# Patient Record
Sex: Female | Born: 1985 | Race: Black or African American | Hispanic: No | Marital: Married | Smoking: Never smoker
Health system: Southern US, Community
[De-identification: ages and names within clinical notes are randomized; demographics above are authoritative.]

## PROBLEM LIST (undated history)

## (undated) ENCOUNTER — Inpatient Hospital Stay (HOSPITAL_COMMUNITY): Payer: Self-pay

## (undated) DIAGNOSIS — R51 Headache: Secondary | ICD-10-CM

## (undated) DIAGNOSIS — Z789 Other specified health status: Secondary | ICD-10-CM

## (undated) DIAGNOSIS — O139 Gestational [pregnancy-induced] hypertension without significant proteinuria, unspecified trimester: Secondary | ICD-10-CM

## (undated) DIAGNOSIS — K8020 Calculus of gallbladder without cholecystitis without obstruction: Secondary | ICD-10-CM

## (undated) HISTORY — PX: EYE SURGERY: SHX253

## (undated) HISTORY — PX: DILATION AND CURETTAGE OF UTERUS: SHX78

## (undated) HISTORY — PX: BREAST REDUCTION SURGERY: SHX8

---

## 2011-11-22 ENCOUNTER — Emergency Department (HOSPITAL_COMMUNITY)
Admission: EM | Admit: 2011-11-22 | Discharge: 2011-11-23 | Disposition: A | Discharge status: Home / Self Care | Payer: No Typology Code available for payment source | Attending: Emergency Medicine | Admitting: Emergency Medicine

## 2011-11-22 ENCOUNTER — Encounter (HOSPITAL_COMMUNITY): Payer: Self-pay | Admitting: Emergency Medicine

## 2011-11-22 DIAGNOSIS — Z3490 Encounter for supervision of normal pregnancy, unspecified, unspecified trimester: Secondary | ICD-10-CM

## 2011-11-22 DIAGNOSIS — O219 Vomiting of pregnancy, unspecified: Secondary | ICD-10-CM | POA: Insufficient documentation

## 2011-11-22 DIAGNOSIS — R6883 Chills (without fever): Secondary | ICD-10-CM | POA: Insufficient documentation

## 2011-11-22 NOTE — ED Notes (Signed)
Pt states since early November she has been sick  Pt states she has had nausea  Pt states she has periods of vomiting  Pt states now she is having chills  Last time had vomiting was this morning  Pt states has had abd cramping intermittently

## 2011-11-23 LAB — URINALYSIS, ROUTINE W REFLEX MICROSCOPIC
Glucose, UA: NEGATIVE mg/dL
Specific Gravity, Urine: 1.036 — ABNORMAL HIGH (ref 1.005–1.030)

## 2011-11-23 LAB — URINE MICROSCOPIC-ADD ON

## 2011-11-23 LAB — POCT PREGNANCY, URINE: Preg Test, Ur: POSITIVE — AB

## 2011-11-23 MED ORDER — ONDANSETRON 8 MG PO TBDP
8.0000 mg | ORAL_TABLET | Freq: Once | ORAL | Status: AC
  Administered 2011-11-23: 8 mg via ORAL
  Filled 2011-11-23: qty 1

## 2011-11-23 MED ORDER — PROMETHAZINE HCL 25 MG PO TABS: 25.0000 mg | ORAL_TABLET | Freq: Four times a day (QID) | ORAL | Status: DC | PRN

## 2011-11-23 NOTE — ED Provider Notes (Signed)
Medical screening examination/treatment/procedure(s) were performed by non-physician practitioner and as supervising physician I was immediately available for consultation/collaboration.  Sunnie Nielsen, MD 11/23/11 [phone]

## 2011-11-23 NOTE — ED Provider Notes (Signed)
History     CSN: [id number]  Arrival date & time 11/22/11  2340   First MD Initiated Contact with Patient 11/23/11 0004      Chief Complaint  Patient presents with  . Nausea  . Emesis  . Chills    (Consider location/radiation/quality/duration/timing/severity/associated sxs/prior treatment) HPI Patient presents emergency Department, nausea, vomiting, the last several weeks.  Patient, states that her last period was in September the patient, states that she especially has nausea after eating with some vomiting.  Patient, states that she's not had any abdominal pain, vaginal bleeding, vaginal discharge, weakness, headache, fever, or chest pain. History reviewed. No pertinent past medical history.  Past Surgical History  Procedure Date  . Eye surgery   . Breast reduction surgery     Family History  Problem Relation Age of Onset  . Cancer Mother   . Diabetes Other     History  Substance Use Topics  . Smoking status: Never Smoker   . Smokeless tobacco: Not on file  . Alcohol Use: No    OB History    Grav Para Term Preterm Abortions TAB SAB Ect Mult Living                  Review of Systems All other systems negative except as documented in the HPI. All pertinent positives and negatives as reviewed in the HPI.  Allergies  Review of patient's allergies indicates no known allergies.  Home Medications  No current outpatient prescriptions on file.  BP 141/97  Pulse 88  Temp 97.8 F (36.6 C) (Oral)  Resp 18  SpO2 100%  LMP 09/30/2011  Physical Exam  Nursing note and vitals reviewed. Constitutional: She is oriented to person, place, and time. She appears well-developed and well-nourished.  HENT:  Head: Normocephalic and atraumatic.  Mouth/Throat: Oropharynx is clear and moist.  Neck: Normal range of motion. Neck supple.  Cardiovascular: Normal rate, regular rhythm and normal heart sounds.   Pulmonary/Chest: Effort normal and breath sounds normal.    Abdominal: Soft. Bowel sounds are normal. She exhibits no distension. There is no tenderness. There is no guarding.  Neurological: She is alert and oriented to person, place, and time.  Skin: Skin is warm and dry.    ED Course  Procedures (including critical care time)  Labs Reviewed  URINALYSIS, ROUTINE W REFLEX MICROSCOPIC - Abnormal; Notable for the following:    APPearance CLOUDY (*)     Specific Gravity, Urine 1.036 (*)     Hgb urine dipstick TRACE (*)     All other components within normal limits  POCT PREGNANCY, URINE - Abnormal; Notable for the following:    Preg Test, Ur POSITIVE (*)     All other components within normal limits  URINE MICROSCOPIC-ADD ON - Abnormal; Notable for the following:    Squamous Epithelial / LPF MANY (*)     Bacteria, UA MANY (*)     All other components within normal limits    The patient has no abdominal pain or vaginal bleeding. The patient will be referred to GYN for follow up. She is advised to return here as needed but [facility] would be a better facility for any further GYN care.  MDM          Carlyle Dolly, PA-C 11/23/11 [phone]

## 2011-11-24 LAB — URINE CULTURE: Colony Count: 6000

## 2011-12-16 ENCOUNTER — Encounter (HOSPITAL_COMMUNITY): Payer: Self-pay | Admitting: *Deleted

## 2011-12-16 ENCOUNTER — Inpatient Hospital Stay (HOSPITAL_COMMUNITY)
Admission: AD | Admit: 2011-12-16 | Discharge: 2011-12-16 | Disposition: A | Discharge status: Home / Self Care | Payer: No Typology Code available for payment source | Source: Ambulatory Visit | Attending: Obstetrics & Gynecology | Admitting: Obstetrics & Gynecology

## 2011-12-16 ENCOUNTER — Inpatient Hospital Stay (HOSPITAL_COMMUNITY): Payer: No Typology Code available for payment source

## 2011-12-16 DIAGNOSIS — O219 Vomiting of pregnancy, unspecified: Secondary | ICD-10-CM

## 2011-12-16 DIAGNOSIS — R030 Elevated blood-pressure reading, without diagnosis of hypertension: Secondary | ICD-10-CM | POA: Insufficient documentation

## 2011-12-16 DIAGNOSIS — R109 Unspecified abdominal pain: Secondary | ICD-10-CM

## 2011-12-16 DIAGNOSIS — O99891 Other specified diseases and conditions complicating pregnancy: Secondary | ICD-10-CM | POA: Insufficient documentation

## 2011-12-16 DIAGNOSIS — O210 Mild hyperemesis gravidarum: Secondary | ICD-10-CM | POA: Insufficient documentation

## 2011-12-16 HISTORY — DX: Calculus of gallbladder without cholecystitis without obstruction: K80.20

## 2011-12-16 LAB — URINALYSIS, ROUTINE W REFLEX MICROSCOPIC
Leukocytes, UA: NEGATIVE
Nitrite: NEGATIVE
Specific Gravity, Urine: >1.03 — ABNORMAL HIGH (ref 1.005–1.030)
pH: 6 (ref 5.0–8.0)

## 2011-12-16 LAB — URINE MICROSCOPIC-ADD ON

## 2011-12-16 MED ORDER — ONDANSETRON 4 MG PO TBDP: 4.0000 mg | ORAL_TABLET | Freq: Four times a day (QID) | ORAL | Status: DC | PRN

## 2011-12-16 MED ORDER — ONDANSETRON 4 MG PO TBDP
4.0000 mg | ORAL_TABLET | Freq: Once | ORAL | Status: AC
  Administered 2011-12-16: 4 mg via ORAL
  Filled 2011-12-16: qty 1

## 2011-12-16 MED ORDER — ONDANSETRON 4 MG PO TBDP: 4.0000 mg | ORAL_TABLET | Freq: Once | ORAL | Status: DC

## 2011-12-16 MED ORDER — CONCEPT OB 130-92.4-1 MG PO CAPS: 1.0000 | ORAL_CAPSULE | ORAL | Status: DC

## 2011-12-16 NOTE — MAU Provider Note (Signed)
Chief Complaint: Emesis During Pregnancy and Pelvic Pain      SUBJECTIVE HPI: Jill Reeves is a 26 y.o. G2P0010 at [redacted]w[redacted]d by LMP who presents with two-day history of left suprapubic pain and also blood-tinged vomitus once today. Pregnancy confirmed in [facility] where she was seen for nausea 11/22/2011. Phenergan not helping. Denies dysuria, frequency or urgency of urination, hematuria. Has appointment to start care at [facility].  Past Medical History  Diagnosis Date  . Gallstones   Has been told her BP was elevated in past , but no dx CHTN or tx.   OB History    Grav Para Term Preterm Abortions TAB SAB Ect Mult Living   2    1  1         # Outc Date GA Lbr Len/2nd Wgt Sex Del Anes PTL Lv   1 SAB            2 CUR              Past Surgical History  Procedure Date  . Eye surgery   . Breast reduction surgery    History   Social History  . Marital Status: Married    Spouse Name: N/A    Number of Children: N/A  . Years of Education: N/A   Occupational History  . Not on file.   Social History Main Topics  . Smoking status: Never Smoker   . Smokeless tobacco: Not on file  . Alcohol Use: No  . Drug Use: No  . Sexually Active: Yes    Birth Control/ Protection: None   Other Topics Concern  . Not on file   Social History Narrative  . No narrative on file   No current facility-administered medications on file prior to encounter.   Current Outpatient Prescriptions on File Prior to Encounter  Medication Sig Dispense Refill  . promethazine (PHENERGAN) 25 MG tablet Take 1 tablet (25 mg total) by mouth every 6 (six) hours as needed for nausea.  10 tablet  0   No Known Allergies  ROS: Pertinent items in HPI  OBJECTIVE Blood pressure 140/85, pulse 89, temperature 98.2 F (36.8 C), temperature source Oral, resp. rate 20, height 5\' 1"  (1.549 m), weight 186 lb (84.369 kg), last menstrual period 09/30/2011. GENERAL: Well-developed, well-nourished female in no acute distress.  HEENT:  Normocephalic HEART: normal rate RESP: normal effort ABDOMEN: Soft, non-tender EXTREMITIES: Nontender, no edema NEURO: Alert and oriented SPECULUM EXAM: NEFG, physiologic discharge, no blood noted, cervix clean BIMANUAL: cervix L/C; uterus normal size, no adnexal tenderness or masses  LAB RESULTS Results for orders placed during the hospital encounter of 12/16/11 (from the past 24 hour(s))  URINALYSIS, ROUTINE W REFLEX MICROSCOPIC     Status: Abnormal   Collection Time   12/16/11  4:00 AM      Component Value Range   Color, Urine YELLOW  YELLOW   APPearance CLEAR  CLEAR   Specific Gravity, Urine >1.030 (*) 1.005 - 1.030   pH 6.0  5.0 - 8.0   Glucose, UA NEGATIVE  NEGATIVE mg/dL   Hgb urine dipstick TRACE (*) NEGATIVE   Bilirubin Urine NEGATIVE  NEGATIVE   Ketones, ur 15 (*) NEGATIVE mg/dL   Protein, ur 30 (*) NEGATIVE mg/dL   Urobilinogen, UA 0.2  0.0 - 1.0 mg/dL   Nitrite NEGATIVE  NEGATIVE   Leukocytes, UA NEGATIVE  NEGATIVE  URINE MICROSCOPIC-ADD ON     Status: Abnormal   Collection Time   12/16/11  4:00  AM      Component Value Range   Squamous Epithelial / LPF MANY (*) RARE   WBC, UA 3-6  <3 WBC/hpf   RBC / HPF 0-2  <3 RBC/hpf   Bacteria, UA MANY (*) RARE   Casts GRANULAR CAST (*) NEGATIVE   Urine-Other MUCOUS PRESENT      IMAGING No results found. US Ob Comp Less 14 Wks  12/16/2011  *RADIOLOGY REPORT*  Clinical Data: The left pelvic pain.  Estimated gestational age by LMP is 11 weeks 0 days.  Quantitative beta HCG results not available.  OBSTETRIC <14 WK ULTRASOUND  Technique:  Transabdominal ultrasound was performed for evaluation of the gestation as well as the maternal uterus and adnexal regions.  Comparison:  None.  Intrauterine gestational sac: A single intrauterine pregnancy is identified.  No evidence of subchorionic hemorrhage. Yolk sac: The yolk sac is not visualized. Embryo: The fetal pole is visualized. Cardiac Activity: Fetal cardiac activity is observed.  Heart Rate: 156 bpm  CRL:  40 mm  11 w  0 d        [place] EDC: 07/06/2012  Maternal uterus/Adnexae: No myometrial masses are visualized.  Both ovaries are visualized and are symmetrical in size and shape.  No abnormal adnexal masses. No free pelvic fluid collections.  IMPRESSION: Single intrauterine pregnancy is visualized.  Estimated gestational age by crown-rump length is 11 weeks 0 days.   Original Report Authenticated By: Burman Nieves, M.D.      ASSESSMENT 1. Nausea/vomiting in pregnancy   2. Elevated BP   3. Abdominal pain in pregnancy   G2P0010 at 11 wk viable IUP  PLAN Discharge home.  Pt ed on N/V in pregnancy, abd pain in pregnancy Follow-up Information    Follow up with [facility] [place]. (Keep your scheduled appointment)    Contact information:   [address] [place] [address] [phone]          Medication List     As of 12/16/2011  6:35 AM    TAKE these medications         CONCEPT OB 130-92.4-1 MG Caps   Take 1 tablet by mouth 1 day or 1 dose.      promethazine 25 MG tablet   Commonly known as: PHENERGAN   Take 1 tablet (25 mg total) by mouth every 6 (six) hours as needed for nausea.          Danae Orleans, CNM 12/16/2011  4:30 AM

## 2011-12-16 NOTE — MAU Note (Signed)
Pains in the left groin area for the last 2 days. Vomiting with a small amount of blood

## 2011-12-17 LAB — URINE CULTURE: Colony Count: 15000

## 2011-12-25 ENCOUNTER — Encounter: Payer: Self-pay | Admitting: Advanced Practice Midwife

## 2012-02-01 ENCOUNTER — Other Ambulatory Visit (HOSPITAL_COMMUNITY): Payer: Self-pay | Admitting: Physician Assistant

## 2012-02-01 DIAGNOSIS — Z3689 Encounter for other specified antenatal screening: Secondary | ICD-10-CM

## 2012-02-01 LAB — OB RESULTS CONSOLE RPR
RPR: NONREACTIVE
RPR: NONREACTIVE

## 2012-02-01 LAB — OB RESULTS CONSOLE HGB/HCT, BLOOD
HCT: 37 %
Hemoglobin: 12.8 g/dL
Hemoglobin: 12.8 g/dL

## 2012-02-01 LAB — OB RESULTS CONSOLE ABO/RH: RH Type: POSITIVE

## 2012-02-01 LAB — OB RESULTS CONSOLE PLATELET COUNT: Platelets: 297 10*3/uL

## 2012-02-01 LAB — GLUCOSE, 1 HOUR: Glucose, 1 hour: 118

## 2012-02-01 LAB — CULTURE, OB URINE: Urine Culture, OB: NEGATIVE

## 2012-02-01 LAB — CYSTIC FIBROSIS DIAGNOSTIC STUDY: Interpretation-CFDNA:: NEGATIVE

## 2012-02-01 LAB — OB RESULTS CONSOLE HIV ANTIBODY (ROUTINE TESTING): HIV: NONREACTIVE

## 2012-02-01 LAB — OB RESULTS CONSOLE GC/CHLAMYDIA
Chlamydia: NEGATIVE
Gonorrhea: NEGATIVE

## 2012-02-06 ENCOUNTER — Ambulatory Visit (HOSPITAL_COMMUNITY)
Admission: RE | Admit: 2012-02-06 | Discharge: 2012-02-06 | Disposition: A | Discharge status: Home / Self Care | Payer: Medicaid Other | Source: Ambulatory Visit | Attending: Physician Assistant | Admitting: Physician Assistant

## 2012-02-06 DIAGNOSIS — Z363 Encounter for antenatal screening for malformations: Secondary | ICD-10-CM | POA: Insufficient documentation

## 2012-02-06 DIAGNOSIS — Z1389 Encounter for screening for other disorder: Secondary | ICD-10-CM | POA: Insufficient documentation

## 2012-02-06 DIAGNOSIS — O358XX0 Maternal care for other (suspected) fetal abnormality and damage, not applicable or unspecified: Secondary | ICD-10-CM | POA: Insufficient documentation

## 2012-02-06 DIAGNOSIS — Z3689 Encounter for other specified antenatal screening: Secondary | ICD-10-CM

## 2012-03-02 ENCOUNTER — Encounter (HOSPITAL_COMMUNITY): Payer: Self-pay

## 2012-03-02 ENCOUNTER — Inpatient Hospital Stay (HOSPITAL_COMMUNITY)
Admission: AD | Admit: 2012-03-02 | Discharge: 2012-03-02 | Disposition: A | Discharge status: Home / Self Care | Payer: Medicaid Other | Source: Ambulatory Visit | Attending: Obstetrics and Gynecology | Admitting: Obstetrics and Gynecology

## 2012-03-02 DIAGNOSIS — O99891 Other specified diseases and conditions complicating pregnancy: Secondary | ICD-10-CM | POA: Insufficient documentation

## 2012-03-02 DIAGNOSIS — R109 Unspecified abdominal pain: Secondary | ICD-10-CM | POA: Insufficient documentation

## 2012-03-02 DIAGNOSIS — N949 Unspecified condition associated with female genital organs and menstrual cycle: Secondary | ICD-10-CM

## 2012-03-02 HISTORY — DX: Other specified health status: Z78.9

## 2012-03-02 LAB — URINALYSIS, ROUTINE W REFLEX MICROSCOPIC
Hgb urine dipstick: NEGATIVE
Protein, ur: NEGATIVE mg/dL
Urobilinogen, UA: 0.2 mg/dL (ref 0.0–1.0)

## 2012-03-02 NOTE — MAU Note (Signed)
PT SAYS SHE CAME HERE IN DEC FOR SAME ABD PAIN-   THIS PAIN HAS CONTINUED   THRU JAN AND FEB - BUT  BECAME WORSE  ON WED-   WHILE SHE  WAS LYING IN BED. LAST SEX WAS -     Tuesday .  PAIN BECAME WORSE ON WED.   SAYS SPOTTING STARTED  ON 2-18-  - NOT EVERY DAY.    LAST TIME WAS 0230- FOR SPOTTING-  BROWNISH RED-ONLY WHEN SHE WIPES.    GOES TO HD ON [address] FOR PNC- LAST SEEN ON 2-4.  NEXT APPOINTMENT  IS 3-4.   PAIN - NOW IS SAME  AS HAS BEEN.

## 2012-03-02 NOTE — MAU Provider Note (Signed)
  History     CSN: [id number]  Arrival date and time: 03/02/12 2002   None     Chief Complaint  Patient presents with  . Abdominal Pain  . Vaginal Bleeding   HPI Ms Copen is a G2P0010 at 22.0 wks who presents for eval of intermittent abd pain, at times crampy and at times sharp. Describes the frequency of discomfort with vague detail. Reports occ spotting (none currently) but denies bldg or leak. No fever, N/V/D. She receives [facility] care at [facility].  OB History   Grav Para Term Preterm Abortions TAB SAB Ect Mult Living   2    1  1          Past Medical History  Diagnosis Date  . Gallstones   . Medical history non-contributory     Past Surgical History  Procedure Laterality Date  . Eye surgery    . Breast reduction surgery      Family History  Problem Relation Age of Onset  . Cancer Mother   . Diabetes Other     History  Substance Use Topics  . Smoking status: Never Smoker   . Smokeless tobacco: Not on file  . Alcohol Use: No    Allergies: No Known Allergies  Prescriptions prior to admission  Medication Sig Dispense Refill  . acetaminophen (TYLENOL) 325 MG tablet Take 650 mg by mouth every 6 (six) hours as needed for pain.      Burnis Medin w/o A Vit-FeFum-FePo-FA (CONCEPT OB) 130-92.4-1 MG CAPS Take 1 tablet by mouth 1 day or 1 dose.  30 capsule  5  . [DISCONTINUED] ondansetron (ZOFRAN ODT) 4 MG disintegrating tablet Take 1 tablet (4 mg total) by mouth every 6 (six) hours as needed for nausea.  20 tablet  0  . [DISCONTINUED] promethazine (PHENERGAN) 25 MG tablet Take 1 tablet (25 mg total) by mouth every 6 (six) hours as needed for nausea.  10 tablet  0    ROS Physical Exam   Blood pressure 122/81, pulse 97, temperature 97.9 F (36.6 C), resp. rate 20, height 5' (1.524 m), weight 175 lb (79.379 kg), last menstrual period 09/30/2011, SpO2 100.00%.  Physical Exam  Constitutional: She is oriented to person, place, and time. She appears well-developed.  obese   HENT:  Head: Normocephalic.  Cardiovascular: Normal rate.   Respiratory: Effort normal.  GI: Soft.  Gravid No ctx per toco  Genitourinary: Vagina normal.  Cx C/L/post  Musculoskeletal: Normal range of motion.  Neurological: She is alert and oriented to person, place, and time.  Skin: Skin is warm and dry.  Psychiatric: She has a normal mood and affect. Her behavior is normal. Thought content normal.   Urinalysis    Component Value Date/Time   COLORURINE YELLOW 03/02/2012 2015   APPEARANCEUR CLEAR 03/02/2012 2015   LABSPEC 1.015 03/02/2012 2015   PHURINE 7.0 03/02/2012 2015   GLUCOSEU NEGATIVE 03/02/2012 2015   HGBUR NEGATIVE 03/02/2012 2015   BILIRUBINUR NEGATIVE 03/02/2012 2015   KETONESUR 15* 03/02/2012 2015   PROTEINUR NEGATIVE 03/02/2012 2015   UROBILINOGEN 0.2 03/02/2012 2015   NITRITE NEGATIVE 03/02/2012 2015   LEUKOCYTESUR NEGATIVE 03/02/2012 2015     MAU Course  Procedures    Assessment and Plan  IUP at 22.0wks Round lig pain  D/C home with comfort tips (Tylenol, warm baths, heating pad to back/side prn) F/U as scheduled on 03/06/12 at [facility], [facility] 03/02/2012, 10:03 PM

## 2012-03-02 NOTE — MAU Note (Signed)
Been having lower abd pain for couple days. Sharp pain that comes and goes. Some light spotting

## 2012-03-07 NOTE — MAU Provider Note (Signed)
Attestation of Attending Supervision of Advanced Practitioner (CNM/NP): Evaluation and management procedures were performed by the Advanced Practitioner under my supervision and collaboration.  I have reviewed the Advanced Practitioner's note and chart, and I agree with the management and plan.  Chiquetta Langner 03/07/2012 9:12 AM

## 2012-04-06 ENCOUNTER — Encounter (HOSPITAL_COMMUNITY): Payer: Self-pay | Admitting: *Deleted

## 2012-04-06 ENCOUNTER — Inpatient Hospital Stay (HOSPITAL_COMMUNITY)
Admission: AD | Admit: 2012-04-06 | Discharge: 2012-04-07 | Disposition: A | Discharge status: Home / Self Care | Payer: Medicaid Other | Source: Ambulatory Visit | Attending: Obstetrics and Gynecology | Admitting: Obstetrics and Gynecology

## 2012-04-06 DIAGNOSIS — O99891 Other specified diseases and conditions complicating pregnancy: Secondary | ICD-10-CM | POA: Insufficient documentation

## 2012-04-06 DIAGNOSIS — R109 Unspecified abdominal pain: Secondary | ICD-10-CM | POA: Insufficient documentation

## 2012-04-06 LAB — URINALYSIS, ROUTINE W REFLEX MICROSCOPIC
Bilirubin Urine: NEGATIVE
Glucose, UA: NEGATIVE mg/dL
Hgb urine dipstick: NEGATIVE
Ketones, ur: NEGATIVE mg/dL
Leukocytes, UA: NEGATIVE
Protein, ur: NEGATIVE mg/dL

## 2012-04-06 NOTE — MAU Note (Signed)
Pain in L side of abdomen for couple hours. Possibly having contractions but stopped on way to hosp.

## 2012-04-07 LAB — COMPREHENSIVE METABOLIC PANEL
Albumin: 2.4 g/dL — ABNORMAL LOW (ref 3.5–5.2)
BUN: 4 mg/dL — ABNORMAL LOW (ref 6–23)
Calcium: 9.2 mg/dL (ref 8.4–10.5)
Creatinine, Ser: 0.54 mg/dL (ref 0.50–1.10)
GFR calc Af Amer: >90 mL/min (ref >90)
Total Protein: 6.7 g/dL (ref 6.0–8.3)

## 2012-04-07 LAB — CBC
HCT: 34.4 % — ABNORMAL LOW (ref 36.0–46.0)
MCH: 28.5 pg (ref 26.0–34.0)
MCHC: 33.4 g/dL (ref 30.0–36.0)
MCV: 85.1 fL (ref 78.0–100.0)
RDW: 14.3 % (ref 11.5–15.5)

## 2012-04-07 LAB — PROTEIN / CREATININE RATIO, URINE
Creatinine, Urine: 79.29 mg/dL
Total Protein, Urine: 7.7 mg/dL

## 2012-04-07 NOTE — Progress Notes (Signed)
Attestation of Attending Supervision of Advanced Practitioner (CNM/NP): Evaluation and management procedures were performed by the Advanced Practitioner under my supervision and collaboration.  I have reviewed the Advanced Practitioner's note and chart, and I agree with the management and plan.  Luster Hechler 04/07/2012 7:47 AM

## 2012-04-07 NOTE — Progress Notes (Signed)
History     CSN: [id number]  Arrival date & time 04/06/12  2316   None     Chief Complaint  Patient presents with  . Abdominal Pain    Abdominal Pain  Ms Radich is a 26yo G2P0010 at 27.1wks who presents with abdominal pain that started about 4 hours ago. Located in [place]. Sharp in nature. Comes and goes. Resolved. Did not take anything for the pain. New problem for this pt. Denies CP, SOB, LE swelling, syncope, n/v/d/c, change in vision, HA. Daily BM. Taking PNV. No complications during this pregnancy. No recent intercourse  Past Medical History  Diagnosis Date  . Gallstones   . Medical history non-contributory     Past Surgical History  Procedure Laterality Date  . Eye surgery    . Breast reduction surgery      Family History  Problem Relation Age of Onset  . Cancer Mother   . Diabetes Other     History  Substance Use Topics  . Smoking status: Never Smoker   . Smokeless tobacco: Never Used  . Alcohol Use: No    OB History   Grav Para Term Preterm Abortions TAB SAB Ect Mult Living   2    1  1          Review of Systems  Gastrointestinal: Positive for abdominal pain.    Allergies  Review of patient's allergies indicates no known allergies.  Home Medications  No current outpatient prescriptions on file.  BP 126/82  Pulse 75  Temp(Src) 97.7 F (36.5 C)  Resp 20  Ht 5' (1.524 m)  Wt 81.012 kg (178 lb 9.6 oz)  BMI 34.88 kg/m2  SpO2 100%  LMP 09/30/2011  Physical Exam  Constitutional: She is oriented to person, place, and time. She appears well-developed and well-nourished. No distress.  HENT:  Head: Normocephalic.  Eyes: Pupils are equal, round, and reactive to light.  Neck: Normal range of motion.  Cardiovascular: Normal rate.   Pulmonary/Chest: Effort normal.  Abdominal: Soft. She exhibits no distension. There is no tenderness. There is no rebound.  Genitourinary: Vagina normal and uterus normal.  Cervix closed thick and long No cervical wall  motion tenderness  Musculoskeletal: Normal range of motion.  Neurological: She is alert and oriented to person, place, and time.  Skin: Skin is warm and dry. No rash noted. She is not diaphoretic.   FHR intermittent tracing with baseline 135, + accels, no decels Rare ctx per toco MAU Course  Procedures (including critical care time)  Labs Reviewed  URINALYSIS, ROUTINE W REFLEX MICROSCOPIC - Abnormal; Notable for the following:    Specific Gravity, Urine <1.005 (*)    All other components within normal limits  CBC - Abnormal; Notable for the following:    WBC 14.8 (*)    Hemoglobin 11.5 (*)    HCT 34.4 (*)    All other components within normal limits  COMPREHENSIVE METABOLIC PANEL - Abnormal; Notable for the following:    Sodium 131 (*)    Potassium 3.4 (*)    BUN 4 (*)    Albumin 2.4 (*)    Alkaline Phosphatase 119 (*)    All other components within normal limits  PROTEIN / CREATININE RATIO, URINE  0.10    No diagnosis found.    MDM  26yo G2P0010 at 27.2wks w/ resolved L sided abdominal pain. Round ligament pain or some gassy indegestion are the most likely causes. Labs reviewed and nml. Pt w/ good PNC. Pt  on monitor for greater than 1 hr w/o evidence of contractions. Fetal tracing reasuring. Cervical exam closed/thick/long. Pt reassured and given precautions.   I participated in the care of this pt and I agree with the above. Cam Hai 4:13 AM 04/07/2012

## 2012-04-18 ENCOUNTER — Other Ambulatory Visit (HOSPITAL_COMMUNITY): Payer: Self-pay | Admitting: Nurse Practitioner

## 2012-04-18 DIAGNOSIS — O3663X1 Maternal care for excessive fetal growth, third trimester, fetus 1: Secondary | ICD-10-CM

## 2012-04-20 ENCOUNTER — Ambulatory Visit (HOSPITAL_COMMUNITY): Payer: Medicaid Other

## 2012-04-24 ENCOUNTER — Ambulatory Visit (HOSPITAL_COMMUNITY)
Admission: RE | Admit: 2012-04-24 | Discharge: 2012-04-24 | Disposition: A | Discharge status: Home / Self Care | Payer: Medicaid Other | Source: Ambulatory Visit | Attending: Nurse Practitioner | Admitting: Nurse Practitioner

## 2012-04-24 DIAGNOSIS — O3660X0 Maternal care for excessive fetal growth, unspecified trimester, not applicable or unspecified: Secondary | ICD-10-CM | POA: Insufficient documentation

## 2012-04-24 DIAGNOSIS — O3663X1 Maternal care for excessive fetal growth, third trimester, fetus 1: Secondary | ICD-10-CM

## 2012-04-24 DIAGNOSIS — Z3689 Encounter for other specified antenatal screening: Secondary | ICD-10-CM | POA: Insufficient documentation

## 2012-04-30 ENCOUNTER — Inpatient Hospital Stay (HOSPITAL_COMMUNITY)
Admission: AD | Admit: 2012-04-30 | Discharge: 2012-04-30 | Disposition: A | Discharge status: Home / Self Care | Payer: BC Managed Care – PPO | Source: Ambulatory Visit | Attending: Obstetrics and Gynecology | Admitting: Obstetrics and Gynecology

## 2012-04-30 ENCOUNTER — Encounter (HOSPITAL_COMMUNITY): Payer: Self-pay | Admitting: *Deleted

## 2012-04-30 DIAGNOSIS — R51 Headache: Secondary | ICD-10-CM | POA: Insufficient documentation

## 2012-04-30 DIAGNOSIS — R030 Elevated blood-pressure reading, without diagnosis of hypertension: Secondary | ICD-10-CM | POA: Insufficient documentation

## 2012-04-30 DIAGNOSIS — O26893 Other specified pregnancy related conditions, third trimester: Secondary | ICD-10-CM

## 2012-04-30 DIAGNOSIS — O99891 Other specified diseases and conditions complicating pregnancy: Secondary | ICD-10-CM | POA: Insufficient documentation

## 2012-04-30 DIAGNOSIS — O133 Gestational [pregnancy-induced] hypertension without significant proteinuria, third trimester: Secondary | ICD-10-CM

## 2012-04-30 DIAGNOSIS — O26899 Other specified pregnancy related conditions, unspecified trimester: Secondary | ICD-10-CM

## 2012-04-30 HISTORY — DX: Headache: R51

## 2012-04-30 LAB — CBC
MCH: 28.2 pg (ref 26.0–34.0)
MCHC: 33 g/dL (ref 30.0–36.0)
MCV: 85.4 fL (ref 78.0–100.0)
Platelets: 270 10*3/uL (ref 150–400)
RBC: 4.11 MIL/uL (ref 3.87–5.11)

## 2012-04-30 LAB — COMPREHENSIVE METABOLIC PANEL
AST: 13 U/L (ref 0–37)
CO2: 25 mEq/L (ref 19–32)
Calcium: 9.7 mg/dL (ref 8.4–10.5)
Creatinine, Ser: 0.74 mg/dL (ref 0.50–1.10)
GFR calc Af Amer: >90 mL/min (ref >90)
GFR calc non Af Amer: >90 mL/min (ref >90)
Sodium: 135 mEq/L (ref 135–145)
Total Protein: 6.5 g/dL (ref 6.0–8.3)

## 2012-04-30 LAB — URINALYSIS, ROUTINE W REFLEX MICROSCOPIC
Bilirubin Urine: NEGATIVE
Hgb urine dipstick: NEGATIVE
Ketones, ur: NEGATIVE mg/dL
Nitrite: NEGATIVE
Protein, ur: NEGATIVE mg/dL
Specific Gravity, Urine: 1.02 (ref 1.005–1.030)
Urobilinogen, UA: 0.2 mg/dL (ref 0.0–1.0)

## 2012-04-30 LAB — PROTEIN / CREATININE RATIO, URINE
Protein Creatinine Ratio: 0.08 (ref 0.00–0.15)
Total Protein, Urine: 15.1 mg/dL

## 2012-04-30 MED ORDER — BUTALBITAL-APAP-CAFFEINE 50-325-40 MG PO TABS: 1.0000 | ORAL_TABLET | Freq: Four times a day (QID) | ORAL | Status: DC | PRN

## 2012-04-30 MED ORDER — BUTALBITAL-APAP-CAFFEINE 50-325-40 MG PO TABS
1.0000 | ORAL_TABLET | Freq: Once | ORAL | Status: AC
  Administered 2012-04-30: 1 via ORAL
  Filled 2012-04-30: qty 1

## 2012-04-30 NOTE — MAU Provider Note (Signed)
History     CSN: [id number]  Arrival date and time: 04/30/12 1408   None     Chief Complaint  Patient presents with  . Headache  . Hypertension   HPI  Jill Reeves is a 27 y.o. G2P0010 at [redacted]w[redacted]d who presents complaining of a headache for the last 3 days, and elevated blood pressure.  Pt [facility] to health department for her prenatal care and states that she was sent here by a health department provider. Her blood pressure has been reportedly elevated several times during her pregnancy. Over Easter weekend her mom checked it at home manually and got 220/150, which improved to 160s/100s one hour later. Yesterday, patient went to [facility] and checked it and found it to be 147/97. She reports that she recently did PIH labs at the health department a few weeks ago.   She has had a headache for the last 3 days. Sometimes sees a black spot, which happens every day on and off throughout the day. Sometimes her vision is blurry and she has to blink a couple of times. She denies abdominal pain. No bleeding, fluid leaking, or contractions. She does feel baby move. Has not noticed any swelling in her legs but says she can no longer wear her wedding ring.  Denies having any problems during this pregnancy, or any history of chronic hypertension outside of pregnancy.   OB History   Grav Para Term Preterm Abortions TAB SAB Ect Mult Living   2    1  1          Past Medical History  Diagnosis Date  . Gallstones   . Medical history non-contributory   . Headache     Past Surgical History  Procedure Laterality Date  . Eye surgery    . Breast reduction surgery      Family History  Problem Relation Age of Onset  . Cancer Mother   . Diabetes Other     History  Substance Use Topics  . Smoking status: Never Smoker   . Smokeless tobacco: Never Used  . Alcohol Use: No    Allergies: No Known Allergies  Prescriptions prior to admission  Medication Sig Dispense Refill  . acetaminophen (TYLENOL)  325 MG tablet Take 650 mg by mouth every 6 (six) hours as needed for pain.      Marland Kitchen omeprazole (PRILOSEC) 20 MG capsule Take 20 mg by mouth daily as needed.      . Prenatal Vit-Fe Fumarate-FA (PRENATAL MULTIVITAMIN) TABS Take 1 tablet by mouth daily at 12 noon.        ROS Physical Exam   Blood pressure 121/75, pulse 83, temperature 98.1 F (36.7 C), temperature source Oral, resp. rate 18, height 5\' 1"  (1.549 m), weight 181 lb (82.101 kg), last menstrual period 09/30/2011.  Physical Exam Gen: NAD Heart: RRR Lungs: CTAB, NWOB Abd: gravid but otherwise soft, nontender to palpation Ext: trace lower extremity edema bilaterally Neuro: grossly nonfocal, speech intact. No hyperreflexia of patellar reflexes. No clonus bilaterally.   MAU Course  Procedures  Results for orders placed during the hospital encounter of 04/30/12 (from the past 24 hour(s))  URINALYSIS, ROUTINE W REFLEX MICROSCOPIC     Status: Abnormal   Collection Time    04/30/12  2:23 PM      Result Value Range   Color, Urine YELLOW  YELLOW   APPearance HAZY (*) CLEAR   Specific Gravity, Urine 1.020  1.005 - 1.030   pH 6.0  5.0 - 8.0  Glucose, UA NEGATIVE  NEGATIVE mg/dL   Hgb urine dipstick NEGATIVE  NEGATIVE   Bilirubin Urine NEGATIVE  NEGATIVE   Ketones, ur NEGATIVE  NEGATIVE mg/dL   Protein, ur NEGATIVE  NEGATIVE mg/dL   Urobilinogen, UA 0.2  0.0 - 1.0 mg/dL   Nitrite NEGATIVE  NEGATIVE   Leukocytes, UA NEGATIVE  NEGATIVE  PROTEIN / CREATININE RATIO, URINE     Status: None   Collection Time    04/30/12  2:25 PM      Result Value Range   Creatinine, Urine 190.17     Total Protein, Urine 15.1     PROTEIN CREATININE RATIO 0.08  0.00 - 0.15  CBC     Status: Abnormal   Collection Time    04/30/12  2:58 PM      Result Value Range   WBC 10.3  4.0 - 10.5 K/uL   RBC 4.11  3.87 - 5.11 MIL/uL   Hemoglobin 11.6 (*) 12.0 - 15.0 g/dL   HCT 16.1 (*) 09.6 - 04.5 %   MCV 85.4  78.0 - 100.0 fL   MCH 28.2  26.0 - 34.0 pg    MCHC 33.0  30.0 - 36.0 g/dL   RDW 40.9  81.1 - 91.4 %   Platelets 270  150 - 400 K/uL  COMPREHENSIVE METABOLIC PANEL     Status: Abnormal   Collection Time    04/30/12  2:58 PM      Result Value Range   Sodium 135  135 - 145 mEq/L   Potassium 4.3  3.5 - 5.1 mEq/L   Chloride 103  96 - 112 mEq/L   CO2 25  19 - 32 mEq/L   Glucose, Bld 90  70 - 99 mg/dL   BUN 8  6 - 23 mg/dL   Creatinine, Ser 7.82  0.50 - 1.10 mg/dL   Calcium 9.7  8.4 - 95.6 mg/dL   Total Protein 6.5  6.0 - 8.3 g/dL   Albumin 2.3 (*) 3.5 - 5.2 g/dL   AST 13  0 - 37 U/L   ALT 6  0 - 35 U/L   Alkaline Phosphatase 143 (*) 39 - 117 U/L   Total Bilirubin 0.2 (*) 0.3 - 1.2 mg/dL   GFR calc non Af Amer >90  >90 mL/min   GFR calc Af Amer >90  >90 mL/min    Assessment and Plan  Jill Reeves is a 27 y.o. G2P0010 at [redacted]w[redacted]d who presents for with headache and report of elevated blood pressures at home. Pt reports marked BP elevation of >220/140, but all BP's here in the MAU have been <= 141/103. PIH workup shows no elevation of urine protein:creatinine ratio. Pt was given rx for flexeril for HA. Patient is stable for discharge at this time and has close follow up already in place with the health department.  Levert Feinstein 04/30/2012, 2:58 PM   I saw and examined patient and agree with above resident note. I reviewed history, imaging, labs, and vitals. I personally reviewed the fetal heart tracing, and it is reactive:  Baseline 140, variability good (1-6 bpm), 10x10 accels present, occasional sharp/miild variable decel - appropriate for gestational age. Napoleon Form, MD

## 2012-04-30 NOTE — MAU Note (Addendum)
Had headache for the past 3 days, took tylenol yesterday no relief.  BP VERY elevated (222/159) at home today, called off and was told to come in.  Here for further eval. Denies visual changes, blurring at times- but not constant. Some swelling in hands, denies epigastric pain.

## 2012-05-02 NOTE — MAU Provider Note (Signed)
Attestation of Attending Supervision of Advanced Practitioner (CNM/NP): Evaluation and management procedures were performed by the Advanced Practitioner under my supervision and collaboration.  I have reviewed the Advanced Practitioner's note and chart, and I agree with the management and plan.  Rowdy Guerrini 05/02/2012 8:50 AM   

## 2012-05-07 ENCOUNTER — Encounter: Payer: Self-pay | Admitting: *Deleted

## 2012-05-10 ENCOUNTER — Encounter (HOSPITAL_COMMUNITY): Payer: Self-pay | Admitting: *Deleted

## 2012-05-10 ENCOUNTER — Inpatient Hospital Stay (HOSPITAL_COMMUNITY)
Admission: AD | Admit: 2012-05-10 | Discharge: 2012-05-10 | Disposition: A | Discharge status: Home / Self Care | Payer: Medicaid Other | Source: Ambulatory Visit | Attending: Obstetrics & Gynecology | Admitting: Obstetrics & Gynecology

## 2012-05-10 ENCOUNTER — Other Ambulatory Visit: Payer: Self-pay | Admitting: Obstetrics & Gynecology

## 2012-05-10 ENCOUNTER — Inpatient Hospital Stay (HOSPITAL_COMMUNITY): Payer: Medicaid Other

## 2012-05-10 ENCOUNTER — Ambulatory Visit (INDEPENDENT_AMBULATORY_CARE_PROVIDER_SITE_OTHER): Payer: BC Managed Care – PPO | Admitting: Obstetrics & Gynecology

## 2012-05-10 ENCOUNTER — Encounter: Payer: Self-pay | Admitting: Obstetrics & Gynecology

## 2012-05-10 VITALS — BP 140/102 | Temp 97.7°F | Ht 60.0 in | Wt 181.3 lb

## 2012-05-10 DIAGNOSIS — O1493 Unspecified pre-eclampsia, third trimester: Secondary | ICD-10-CM

## 2012-05-10 DIAGNOSIS — IMO0002 Reserved for concepts with insufficient information to code with codable children: Secondary | ICD-10-CM

## 2012-05-10 DIAGNOSIS — O1490 Unspecified pre-eclampsia, unspecified trimester: Secondary | ICD-10-CM | POA: Insufficient documentation

## 2012-05-10 DIAGNOSIS — O4700 False labor before 37 completed weeks of gestation, unspecified trimester: Secondary | ICD-10-CM | POA: Insufficient documentation

## 2012-05-10 DIAGNOSIS — O139 Gestational [pregnancy-induced] hypertension without significant proteinuria, unspecified trimester: Secondary | ICD-10-CM | POA: Insufficient documentation

## 2012-05-10 DIAGNOSIS — R51 Headache: Secondary | ICD-10-CM | POA: Insufficient documentation

## 2012-05-10 LAB — COMPREHENSIVE METABOLIC PANEL
AST: 13 U/L (ref 0–37)
BUN: 6 mg/dL (ref 6–23)
CO2: 24 mEq/L (ref 19–32)
Chloride: 101 mEq/L (ref 96–112)
Creatinine, Ser: 0.71 mg/dL (ref 0.50–1.10)
GFR calc Af Amer: >90 mL/min (ref >90)
GFR calc non Af Amer: >90 mL/min (ref >90)
Glucose, Bld: 74 mg/dL (ref 70–99)
Total Bilirubin: 0.2 mg/dL — ABNORMAL LOW (ref 0.3–1.2)

## 2012-05-10 LAB — POCT URINALYSIS DIP (DEVICE)
Nitrite: NEGATIVE
Urobilinogen, UA: 0.2 mg/dL (ref 0.0–1.0)
pH: 5.5 (ref 5.0–8.0)

## 2012-05-10 LAB — CBC
MCH: 28.4 pg (ref 26.0–34.0)
Platelets: 278 10*3/uL (ref 150–400)
RBC: 4.29 MIL/uL (ref 3.87–5.11)
WBC: 12.1 10*3/uL — ABNORMAL HIGH (ref 4.0–10.5)

## 2012-05-10 MED ORDER — BETAMETHASONE SOD PHOS & ACET 6 (3-3) MG/ML IJ SUSP
12.0000 mg | Freq: Once | INTRAMUSCULAR | Status: AC
  Administered 2012-05-10: 12 mg via INTRAMUSCULAR
  Filled 2012-05-10: qty 2

## 2012-05-10 MED ORDER — ACETAMINOPHEN 325 MG PO TABS
650.0000 mg | ORAL_TABLET | Freq: Once | ORAL | Status: AC
  Administered 2012-05-10: 650 mg via ORAL
  Filled 2012-05-10: qty 2

## 2012-05-10 NOTE — Patient Instructions (Signed)

## 2012-05-10 NOTE — Progress Notes (Signed)
Needs further eval for preeclampsia in MAU today.

## 2012-05-10 NOTE — Progress Notes (Signed)
P=113,   C/o sharp pain or pressure occasionally on right and left sides of abdomen, Given new patient information. Discussed BMI and weight gain-states had a lot of morning sickness and lost weight down to 174 and now is gaining . Declines flu shot.

## 2012-05-10 NOTE — MAU Note (Signed)
Sent up from clinic for PIH eval.  Pt is not on meds.  Has a headache today,occ sees black spots; denies epigastric pain or increase in swelling.

## 2012-05-10 NOTE — MAU Provider Note (Signed)
History     CSN: [id number]  Arrival date and time: 05/10/12 1145   None     Chief Complaint  Patient presents with  . sent from office for PIH eval    HPI  Patient states she was sent here from high-risk clinic (Dr Debroah Loop) for evaluation of hypertension Patient denies history of hypertension.  She has taken nothing for her condition.  She is [redacted]w[redacted]d pregnant.   Patient states that she has a 6/10 constant headache that she states she gets regularly, but these have increased in frequency to daily.  She characterizes her pain as throbbing that is mostly right temporal.  She usually takes tylenol for headaches and has been recently prescribed Fiorcet, but hasn't taken any medication for her headaches since Sunday.  She denies cramping or contractions. She denies discharge or bleeding. She feels the baby moving regularly, as recent as a few minutes ago.   She has been eating and drinking regularly as well as having regular and pain-free bowel movements and urination.   She denies fevers.  She denies ETOH, tobacco, or any drug use.   OB History   Grav Para Term Preterm Abortions TAB SAB Ect Mult Living   2    1  1          Past Medical History  Diagnosis Date  . Gallstones   . Medical history non-contributory   . Headache     Past Surgical History  Procedure Laterality Date  . Eye surgery    . Breast reduction surgery      Family History  Problem Relation Age of Onset  . Cancer Mother   . Diabetes Mother   . Hypertension Mother   . Diabetes Other     History  Substance Use Topics  . Smoking status: Never Smoker   . Smokeless tobacco: Never Used  . Alcohol Use: No    Allergies: No Known Allergies  Prescriptions prior to admission  Medication Sig Dispense Refill  . acetaminophen (TYLENOL) 325 MG tablet Take 650 mg by mouth every 6 (six) hours as needed for pain.      . butalbital-acetaminophen-caffeine (FIORICET, ESGIC) 50-325-40 MG per tablet Take 1-2 tablets by  mouth every 6 (six) hours as needed for headache.  20 tablet  0  . omeprazole (PRILOSEC) 20 MG capsule Take 20 mg by mouth daily as needed.      . Prenatal Vit-Fe Fumarate-FA (PRENATAL MULTIVITAMIN) TABS Take 1 tablet by mouth daily at 12 noon.        ROS Pertinent positives and negatives mentioned in HPI  Physical Exam   Blood pressure 138/91, pulse 70, temperature 97.6 F (36.4 C), temperature source Oral, resp. rate 16, last menstrual period 09/30/2011.  Filed Vitals:   05/10/12 1431 05/10/12 1447 05/10/12 1618 05/10/12 1705  BP: 129/79 124/76 122/85 148/91  Pulse: 74 82 79 77  Temp:      TempSrc:      Resp:    18     Physical Exam  Constitutional: She is oriented to person, place, and time. She appears well-developed and well-nourished. No distress.  HENT:  Head: Normocephalic and atraumatic.  Eyes: Conjunctivae and EOM are normal.  Neck: Normal range of motion. Neck supple.  Cardiovascular: Normal rate, regular rhythm and normal heart sounds.   Respiratory: Effort normal and breath sounds normal. No respiratory distress.  GI: Soft. Bowel sounds are normal. There is no tenderness. There is no rebound and no guarding.  Musculoskeletal: She  exhibits edema. She exhibits no tenderness.  Neurological: She is alert and oriented to person, place, and time. She has normal reflexes. She displays normal reflexes.  Skin: Skin is warm and dry.  Psychiatric: She has a normal mood and affect.     Results for orders placed during the hospital encounter of 05/10/12 (from the past 24 hour(s))  PROTEIN / CREATININE RATIO, URINE     Status: None   Collection Time    05/10/12 12:00 PM      Result Value Range   Creatinine, Urine 176.14     Total Protein, Urine PENDING     PROTEIN CREATININE RATIO PENDING  0.00 - 0.15  COMPREHENSIVE METABOLIC PANEL     Status: Abnormal   Collection Time    05/10/12 12:15 PM      Result Value Range   Sodium 134 (*) 135 - 145 mEq/L   Potassium 3.9  3.5  - 5.1 mEq/L   Chloride 101  96 - 112 mEq/L   CO2 24  19 - 32 mEq/L   Glucose, Bld 74  70 - 99 mg/dL   BUN 6  6 - 23 mg/dL   Creatinine, Ser 1.61  0.50 - 1.10 mg/dL   Calcium 9.7  8.4 - 09.6 mg/dL   Total Protein 7.3  6.0 - 8.3 g/dL   Albumin 2.5 (*) 3.5 - 5.2 g/dL   AST 13  0 - 37 U/L   ALT 7  0 - 35 U/L   Alkaline Phosphatase 169 (*) 39 - 117 U/L   Total Bilirubin 0.2 (*) 0.3 - 1.2 mg/dL   GFR calc non Af Amer >90  >90 mL/min   GFR calc Af Amer >90  >90 mL/min  CBC     Status: Abnormal   Collection Time    05/10/12 12:15 PM      Result Value Range   WBC 12.1 (*) 4.0 - 10.5 K/uL   RBC 4.29  3.87 - 5.11 MIL/uL   Hemoglobin 12.2  12.0 - 15.0 g/dL   HCT 04.5  40.9 - 81.1 %   MCV 85.3  78.0 - 100.0 fL   MCH 28.4  26.0 - 34.0 pg   MCHC 33.3  30.0 - 36.0 g/dL   RDW 91.4  78.2 - 95.6 %   Platelets 278  150 - 400 K/uL   FHTs:  Baseline: 130-140 Variability: good (1-6);  Accelerations:  Present (10x 10)  Decelerations:  Occasional mild variable.  Appropriate for gestational age. TOCO:  No ctx  Limited ultrasound:  Cephalic, cervix 3.2 cm, AFI 17.2  MAU Course  Procedures  MDM  Anna Genre 05/10/2012, 12:37 PM   I saw and examined patient and agree with above student note. I reviewed history, imaging, labs, and vitals. I personally reviewed the fetal heart tracing, and it is reactive. Napoleon Form, MD  Assessment and Plan  27 y.o. G2P0010 at [redacted]w[redacted]d with gestational hypertension - PIH work-up - BP all mild range - majority 140s/90-100s, with some normal (120s/70-80s) - Headache improved with tylenol (still 4/10) - LFTs and platelets normal.   Creatinine 0.7 (stable from one week ago) - Urine protein:creatinine pending due to lab mechanical problem - NST reactive - AFI normal  GHTN with likely preeclampsia (increased proteinuria in clinic today - 100 mg) BMZ given today, pt to return tomorrow to MAU for 2nd dose BMZ Home with 24 hour urine protein collection to bring to  MAU tomorrow. Check BP in MAU tomorrow Activity  precautions given, signs/symptoms of preeclampsia discussed  Napoleon Form, MD

## 2012-05-11 ENCOUNTER — Inpatient Hospital Stay (HOSPITAL_COMMUNITY)
Admission: AD | Admit: 2012-05-11 | Discharge: 2012-05-11 | Disposition: A | Discharge status: Home / Self Care | Payer: Medicaid Other | Source: Ambulatory Visit | Attending: Obstetrics & Gynecology | Admitting: Obstetrics & Gynecology

## 2012-05-11 ENCOUNTER — Encounter (HOSPITAL_COMMUNITY): Payer: Self-pay | Admitting: Family

## 2012-05-11 DIAGNOSIS — O133 Gestational [pregnancy-induced] hypertension without significant proteinuria, third trimester: Secondary | ICD-10-CM

## 2012-05-11 DIAGNOSIS — O139 Gestational [pregnancy-induced] hypertension without significant proteinuria, unspecified trimester: Secondary | ICD-10-CM | POA: Insufficient documentation

## 2012-05-11 LAB — PROTEIN, URINE, 24 HOUR
Collection Interval-UPROT: 24 hours
Protein, 24H Urine: 221 mg/d — ABNORMAL HIGH (ref 50–100)
Urine Total Volume-UPROT: 1300 mL

## 2012-05-11 LAB — CREATININE CLEARANCE, URINE, 24 HOUR: Creatinine: 0.71 mg/dL (ref 0.50–1.10)

## 2012-05-11 MED ORDER — BETAMETHASONE SOD PHOS & ACET 6 (3-3) MG/ML IJ SUSP
12.0000 mg | Freq: Once | INTRAMUSCULAR | Status: AC
  Administered 2012-05-11: 12 mg via INTRAMUSCULAR
  Filled 2012-05-11: qty 2

## 2012-05-11 MED ORDER — OXYCODONE-ACETAMINOPHEN 5-325 MG PO TABS
1.0000 | ORAL_TABLET | Freq: Four times a day (QID) | ORAL | Status: DC | PRN
  Filled 2012-05-11: qty 1

## 2012-05-11 NOTE — MAU Provider Note (Signed)
Chief Complaint:  PIH eval    First Provider Initiated Contact with Patient 05/11/12 1921      HPI: Jill Reeves is a 27 y.o. G2P0010 at [redacted]w[redacted]d who was seen here yesterday after [facility] visit for mildly elevated BPs and new onset dipstick proteinuria. She still has a mild dull frontal H/A which was not alleviated by Fiorocet yesterday. No visual disturbance now but has had occasional scotomata for a few weeks. No epigastric pain. Denies contractions, leakage of fluid or vaginal bleeding. Good fetal movement.  She received BMZ #1 yesterday and is to get 2nd injection tonight and turns in 24 hour urine specimen. lLimited [place] 05/10/12 AFI 17.5.   Pregnancy Course: PNC at [facility] with BP elevations at 28wks; baseline 125/76 at 17 wks; dated by LMP c/w 11 wk scan; 1 hr glucola 118; mild obesity  Past Medical History: Past Medical History  Diagnosis Date  . Gallstones   . Medical history non-contributory   . Headache     Past obstetric history: OB History   Grav Para Term Preterm Abortions TAB SAB Ect Mult Living   2    1  1         # Outc Date GA Lbr Len/2nd Wgt Sex Del Anes PTL Lv   1 SAB 2/13 [redacted]w[redacted]d          2 CUR               Past Surgical History: Past Surgical History  Procedure Laterality Date  . Eye surgery    . Breast reduction surgery      Family History: Family History  Problem Relation Age of Onset  . Cancer Mother   . Diabetes Mother   . Hypertension Mother   . Diabetes Other     Social History: History  Substance Use Topics  . Smoking status: Never Smoker   . Smokeless tobacco: Never Used  . Alcohol Use: No    Allergies: No Known Allergies  Meds:  Prescriptions prior to admission  Medication Sig Dispense Refill  . butalbital-acetaminophen-caffeine (FIORICET, ESGIC) 50-325-40 MG per tablet Take 1-2 tablets by mouth every 6 (six) hours as needed for headache.  20 tablet  0  . omeprazole (PRILOSEC) 20 MG capsule Take 20 mg by mouth daily as needed.      .  Prenatal Vit-Fe Fumarate-FA (PRENATAL MULTIVITAMIN) TABS Take 1 tablet by mouth daily at 12 noon.        ROS: Pertinent findings in history of present illness.  Physical Exam  Blood pressure 140/78, pulse 96, resp. rate 18, last menstrual period 09/30/2011. Filed Vitals:   05/11/12 1842  BP: 140/78  Pulse: 96  Resp: 18   GENERAL: Well-developed, well-nourished female in no acute distress.  HEENT: normocephalic HEART: normal rate RESP: normal effort ABDOMEN: Soft, non-tender, gravid appropriate for gestational age EXTREMITIES: Nontender, no edema NEURO: alert and oriented  FHT:  Baseline 125 , moderate variability, accelerations present, no decelerations Contractions: rare, mild   Labs: No results found for this or any previous visit (from the past 24 hour(s)).  Imaging:  US Ob Limited  05/10/2012  OBSTETRICAL ULTRASOUND: This exam was performed within a [facility] [facility]. The OB US report was generated in the AS system, and faxed to the ordering physician.   This report is also available in [employer] and in the [employer]. See AS Obstetric US report.   US Ob Follow Up  04/24/2012  OBSTETRICAL ULTRASOUND: This exam was  performed within a [place]. The OB US report was generated in the AS system, and faxed to the ordering physician.   This report is also available in [employer] and in the [employer]. See AS Obstetric US report.   MAU Course: Received BMZ. Declined Percocet for H/A  Assessment: 1. Gestational hypertension, third trimester   G2P0010 at [redacted]w[redacted]d Category 1 FHR   Plan: Discharge with preE precautions   Medication List    TAKE these medications       butalbital-acetaminophen-caffeine 50-325-40 MG per tablet  Commonly known as:  FIORICET, ESGIC  Take 1-2 tablets by mouth every 6 (six) hours as needed for headache.     omeprazole 20 MG capsule  Commonly known as:   PRILOSEC  Take 20 mg by mouth daily as needed.     prenatal multivitamin Tabs  Take 1 tablet by mouth daily at 12 noon.       Follow-up Information   Follow up with [facility]. Schedule an appointment as soon as possible for a visit in 3 days.       Danae Orleans, CNM 05/11/2012 7:22 PM

## 2012-05-11 NOTE — MAU Note (Signed)
Patient presents to MAU to return 24-hr urine collection; received first dose of BMZ yesterday. Reports HA all day today; denies other lateralizing s/s.  Denies vaginal bleeding, LOF or cramping. Reports +FM.

## 2012-05-11 NOTE — MAU Note (Signed)
Brought in 24hour urine.  Headache all day today, did not take anything, occ blurring/spots- but nothing different than yesterday.

## 2012-05-12 ENCOUNTER — Encounter (HOSPITAL_COMMUNITY): Payer: Self-pay | Admitting: *Deleted

## 2012-05-12 ENCOUNTER — Inpatient Hospital Stay (HOSPITAL_COMMUNITY): Payer: Medicaid Other

## 2012-05-12 ENCOUNTER — Inpatient Hospital Stay (HOSPITAL_COMMUNITY)
Admission: AD | Admit: 2012-05-12 | Discharge: 2012-05-12 | Disposition: A | Discharge status: Home / Self Care | Payer: Medicaid Other | Source: Ambulatory Visit | Attending: Obstetrics & Gynecology | Admitting: Obstetrics & Gynecology

## 2012-05-12 DIAGNOSIS — R0602 Shortness of breath: Secondary | ICD-10-CM | POA: Insufficient documentation

## 2012-05-12 DIAGNOSIS — H538 Other visual disturbances: Secondary | ICD-10-CM | POA: Insufficient documentation

## 2012-05-12 DIAGNOSIS — O368190 Decreased fetal movements, unspecified trimester, not applicable or unspecified: Secondary | ICD-10-CM | POA: Insufficient documentation

## 2012-05-12 DIAGNOSIS — O139 Gestational [pregnancy-induced] hypertension without significant proteinuria, unspecified trimester: Secondary | ICD-10-CM | POA: Insufficient documentation

## 2012-05-12 DIAGNOSIS — O368130 Decreased fetal movements, third trimester, not applicable or unspecified: Secondary | ICD-10-CM

## 2012-05-12 DIAGNOSIS — R51 Headache: Secondary | ICD-10-CM | POA: Insufficient documentation

## 2012-05-12 DIAGNOSIS — R079 Chest pain, unspecified: Secondary | ICD-10-CM | POA: Insufficient documentation

## 2012-05-12 DIAGNOSIS — O99891 Other specified diseases and conditions complicating pregnancy: Secondary | ICD-10-CM | POA: Insufficient documentation

## 2012-05-12 LAB — URINALYSIS, ROUTINE W REFLEX MICROSCOPIC
Glucose, UA: NEGATIVE mg/dL
Protein, ur: NEGATIVE mg/dL
Urobilinogen, UA: 0.2 mg/dL (ref 0.0–1.0)

## 2012-05-12 LAB — URINE MICROSCOPIC-ADD ON

## 2012-05-12 MED ORDER — OXYCODONE-ACETAMINOPHEN 5-325 MG PO TABS
1.0000 | ORAL_TABLET | Freq: Once | ORAL | Status: AC
  Administered 2012-05-12: 1 via ORAL
  Filled 2012-05-12: qty 1

## 2012-05-12 NOTE — MAU Provider Note (Signed)
History     CSN: [id number]  Arrival date and time: 05/12/12 0930   None     Chief Complaint  Patient presents with  . Shortness of Breath   HPI Jill Reeves is a 27 y.o. G2P0010 female at [redacted]w[redacted]d who presents w/ report of 'pounding ha' x >1wk, fioricet not helping- hasn't taken since last sun. Blurred vision w/ occasional spots x 1 wk w/ ha.  Denies ruq/epigastric pain, n/v.  Also reports chest tightness- points to epigastric region, and feeling like she is sob since last night, doesn't resolve w/ position changes.  Denies recent uri.  Reports decreased fm since this am.  Denies uc's, lof, or vb.  Doesn't have another visit at clinic scheduled d/t being sent straight from clinic to mau yesterday.  Will call Mon for new appt.  Pre-e work up from 5/8: labs normal, 24urine protein 221.  Received bmz 5/8 & 5/9.   OB History   Grav Para Term Preterm Abortions TAB SAB Ect Mult Living   2    1  1          Past Medical History  Diagnosis Date  . Gallstones   . Medical history non-contributory   . Headache     Past Surgical History  Procedure Laterality Date  . Eye surgery    . Breast reduction surgery      Family History  Problem Relation Age of Onset  . Cancer Mother   . Diabetes Mother   . Hypertension Mother   . Diabetes Other     History  Substance Use Topics  . Smoking status: Never Smoker   . Smokeless tobacco: Never Used  . Alcohol Use: No    Allergies: No Known Allergies  Prescriptions prior to admission  Medication Sig Dispense Refill  . butalbital-acetaminophen-caffeine (FIORICET, ESGIC) 50-325-40 MG per tablet Take 1-2 tablets by mouth every 6 (six) hours as needed for headache.  20 tablet  0  . omeprazole (PRILOSEC) 20 MG capsule Take 20 mg by mouth daily as needed.      . Prenatal Vit-Fe Fumarate-FA (PRENATAL MULTIVITAMIN) TABS Take 1 tablet by mouth daily at 12 noon.        Review of Systems  Constitutional: Negative.  Negative for fever and chills.   Eyes: Positive for blurred vision (x 1 wk w/ ha).  Respiratory: Positive for shortness of breath.   Cardiovascular: Chest pain: chest tightness.  Gastrointestinal: Negative.  Negative for abdominal pain.  Genitourinary: Negative.   Musculoskeletal: Negative.   Skin: Negative.   Neurological: Positive for headaches.  Endo/Heme/Allergies: Negative.   Psychiatric/Behavioral: Negative.    Physical Exam   Blood pressure 134/87, pulse 93, temperature 98 F (36.7 C), temperature source Oral, resp. rate 20, height 5' (1.524 m), weight 81.829 kg (180 lb 6.4 oz), last menstrual period 09/30/2011, SpO2 100.00%.  Physical Exam  Constitutional: She is oriented to person, place, and time. She appears well-developed and well-nourished.  No acute distress, texting on phone when I walked in  HENT:  Head: Normocephalic.  Neck: Normal range of motion.  Cardiovascular: Normal rate and regular rhythm.   Respiratory: Effort normal and breath sounds normal. No respiratory distress. She has no wheezes. She has no rales.  GI: Soft.  gravid  Musculoskeletal: Normal range of motion.  Neurological: She is alert and oriented to person, place, and time. She has normal reflexes.  No clonus  Skin: Skin is warm and dry.  Psychiatric: She has a normal mood and  affect. Her behavior is normal. Judgment and thought content normal.   FHR: 125, mod variability, mostly 10x10s w/ one 15x15accel, occ mild variables.  FHR reactive w/ 2 15x15's just prior to being taken off of efm for BPP UCs: rare, mild, not perceived by pt  MAU Course  Procedures  VSS w/ 100% O2 sat on RA NST-nonreactive w/ occ decels Physical exam Percocet x 2 for HA 12 lead EKG- SR w/ marked sinus arrhythmia- normal per Dr. Marice Potter  BPP 8/10 (-2 for no breathing movements)  POC discussed w/ Dr. Marice Potter Co-exam w/ Dr. Marice Potter after all results back- Dr. Marice Potter offered d/c vs. tx to [facility] or [facility] to further evaluate sob and chest tightness, pt & SO  discussed option and decided for d/c Assessment and Plan  A:  [redacted]w[redacted]d SIUP  Headache  SOB & Chest tightness  GHTN  Decreased fm  P:  D/C home per pt preference  Call clinic on Mon to get f/u appt scheduled asap  If sob or chest tightness persist/worsen- go to [facility] or [facility] to be evaluated  Return to [facility] for pregnancy related concerns  Reviewed ptl and pre-e s/s, as well as fetal kick counts   Marge Duncans 05/12/2012, 10:07 AM

## 2012-05-12 NOTE — MAU Note (Signed)
Pt reports she started having SOB and CHest tightness that started this morning. Also c/o Headahe.

## 2012-05-13 LAB — URINE CULTURE: Colony Count: 45000

## 2012-05-14 ENCOUNTER — Other Ambulatory Visit: Payer: Self-pay | Admitting: Obstetrics & Gynecology

## 2012-05-14 ENCOUNTER — Inpatient Hospital Stay (HOSPITAL_COMMUNITY)
Admission: AD | Admit: 2012-05-14 | Discharge: 2012-05-15 | DRG: 782 | Disposition: A | Discharge status: Home / Self Care | Payer: Medicaid Other | Source: Ambulatory Visit | Attending: Obstetrics & Gynecology | Admitting: Obstetrics & Gynecology

## 2012-05-14 ENCOUNTER — Inpatient Hospital Stay (HOSPITAL_COMMUNITY): Payer: Medicaid Other

## 2012-05-14 ENCOUNTER — Encounter (HOSPITAL_COMMUNITY): Payer: Self-pay | Admitting: *Deleted

## 2012-05-14 ENCOUNTER — Ambulatory Visit (INDEPENDENT_AMBULATORY_CARE_PROVIDER_SITE_OTHER): Payer: BC Managed Care – PPO | Admitting: Obstetrics & Gynecology

## 2012-05-14 ENCOUNTER — Ambulatory Visit (HOSPITAL_COMMUNITY)
Admission: RE | Admit: 2012-05-14 | Discharge: 2012-05-14 | Disposition: A | Discharge status: Home / Self Care | Payer: Medicaid Other | Source: Ambulatory Visit | Attending: Obstetrics & Gynecology | Admitting: Obstetrics & Gynecology

## 2012-05-14 VITALS — BP 117/90 | Temp 98.6°F | Wt 181.5 lb

## 2012-05-14 DIAGNOSIS — R51 Headache: Secondary | ICD-10-CM | POA: Diagnosis present

## 2012-05-14 DIAGNOSIS — O139 Gestational [pregnancy-induced] hypertension without significant proteinuria, unspecified trimester: Secondary | ICD-10-CM

## 2012-05-14 DIAGNOSIS — O368190 Decreased fetal movements, unspecified trimester, not applicable or unspecified: Secondary | ICD-10-CM | POA: Diagnosis present

## 2012-05-14 DIAGNOSIS — IMO0002 Reserved for concepts with insufficient information to code with codable children: Secondary | ICD-10-CM | POA: Insufficient documentation

## 2012-05-14 DIAGNOSIS — O368390 Maternal care for abnormalities of the fetal heart rate or rhythm, unspecified trimester, not applicable or unspecified: Secondary | ICD-10-CM | POA: Insufficient documentation

## 2012-05-14 DIAGNOSIS — O1493 Unspecified pre-eclampsia, third trimester: Secondary | ICD-10-CM

## 2012-05-14 HISTORY — DX: Gestational (pregnancy-induced) hypertension without significant proteinuria, unspecified trimester: O13.9

## 2012-05-14 LAB — COMPREHENSIVE METABOLIC PANEL
ALT: 9 U/L (ref 0–35)
Alkaline Phosphatase: 173 U/L — ABNORMAL HIGH (ref 39–117)
CO2: 24 mEq/L (ref 19–32)
Chloride: 100 mEq/L (ref 96–112)
GFR calc Af Amer: >90 mL/min (ref >90)
GFR calc non Af Amer: >90 mL/min (ref >90)
Glucose, Bld: 80 mg/dL (ref 70–99)
Potassium: 4.4 mEq/L (ref 3.5–5.1)
Sodium: 134 mEq/L — ABNORMAL LOW (ref 135–145)
Total Bilirubin: 0.2 mg/dL — ABNORMAL LOW (ref 0.3–1.2)
Total Protein: 6.7 g/dL (ref 6.0–8.3)

## 2012-05-14 LAB — POCT URINALYSIS DIP (DEVICE)
Bilirubin Urine: NEGATIVE
Glucose, UA: NEGATIVE mg/dL
Nitrite: NEGATIVE

## 2012-05-14 LAB — CBC
Hemoglobin: 12.5 g/dL (ref 12.0–15.0)
RBC: 4.39 MIL/uL (ref 3.87–5.11)
WBC: 18.7 10*3/uL — ABNORMAL HIGH (ref 4.0–10.5)

## 2012-05-14 MED ORDER — CALCIUM CARBONATE ANTACID 500 MG PO CHEW: 2.0000 | CHEWABLE_TABLET | ORAL | Status: DC | PRN

## 2012-05-14 MED ORDER — BETAMETHASONE SOD PHOS & ACET 6 (3-3) MG/ML IJ SUSP
12.0000 mg | INTRAMUSCULAR | Status: DC
  Filled 2012-05-14: qty 2

## 2012-05-14 MED ORDER — ZOLPIDEM TARTRATE 5 MG PO TABS: 5.0000 mg | ORAL_TABLET | Freq: Every evening | ORAL | Status: DC | PRN

## 2012-05-14 MED ORDER — DOCUSATE SODIUM 100 MG PO CAPS
100.0000 mg | ORAL_CAPSULE | Freq: Every day | ORAL | Status: DC
  Administered 2012-05-15: 100 mg via ORAL
  Filled 2012-05-14: qty 1

## 2012-05-14 MED ORDER — OXYCODONE-ACETAMINOPHEN 5-325 MG PO TABS
1.0000 | ORAL_TABLET | Freq: Once | ORAL | Status: AC
  Administered 2012-05-14: 1 via ORAL
  Filled 2012-05-14: qty 1

## 2012-05-14 MED ORDER — PRENATAL MULTIVITAMIN CH
1.0000 | ORAL_TABLET | Freq: Every day | ORAL | Status: DC
  Administered 2012-05-15: 1 via ORAL
  Filled 2012-05-14: qty 1

## 2012-05-14 MED ORDER — ACETAMINOPHEN 325 MG PO TABS
650.0000 mg | ORAL_TABLET | ORAL | Status: DC | PRN
  Administered 2012-05-15: 650 mg via ORAL
  Filled 2012-05-14: qty 2

## 2012-05-14 NOTE — Progress Notes (Signed)
C/o headaches and shortness of breath.  Concerned about decrease in movement.  Baby is usually very active and has been significantly less active since Saturday.  Felt last movement around 11am today.

## 2012-05-14 NOTE — Progress Notes (Signed)
Fetal movement present during NST and pt was aware. NR NST, accels of 10x10, no decels.  Results reported to Dr. Debroah Loop and pt was sent to radiology for BPP. Full report of pt status given to Dr. Penne Lash.

## 2012-05-14 NOTE — MAU Provider Note (Signed)
History     CSN: [id number]  Arrival date and time: 05/14/12 1811   None     No chief complaint on file.  HPI This is a 27 y.o. female at [redacted]w[redacted]d presents for NST following an equivocal NST in clinic with BPP 6/8 (2 off for breathing)  Also being followed for Gestational hypertension. Last labs a week ago. Has daily headaches. Fioricet not working.    RN Note: Pt sent to MAU from U/S, BPP 6/8. Pt denies bleeding or leaking, does have decreased FM. Has HA, seeing spots.  OB History   Grav Para Term Preterm Abortions TAB SAB Ect Mult Living   2    1  1          Past Medical History  Diagnosis Date  . Gallstones   . Medical history non-contributory   . Headache   . Pregnancy induced hypertension     Past Surgical History  Procedure Laterality Date  . Eye surgery    . Breast reduction surgery      Family History  Problem Relation Age of Onset  . Cancer Mother   . Diabetes Mother   . Hypertension Mother   . Diabetes Other     History  Substance Use Topics  . Smoking status: Never Smoker   . Smokeless tobacco: Never Used  . Alcohol Use: No    Allergies: No Known Allergies  Prescriptions prior to admission  Medication Sig Dispense Refill  . butalbital-acetaminophen-caffeine (FIORICET, ESGIC) 50-325-40 MG per tablet Take 1-2 tablets by mouth every 6 (six) hours as needed for headache.  20 tablet  0  . omeprazole (PRILOSEC) 20 MG capsule Take 20 mg by mouth daily as needed.      . Prenatal Vit-Fe Fumarate-FA (PRENATAL MULTIVITAMIN) TABS Take 1 tablet by mouth daily at 12 noon.        Review of Systems  Constitutional: Negative for fever, chills and malaise/fatigue.  Eyes: Positive for blurred vision.  Gastrointestinal: Negative for nausea, vomiting, abdominal pain, diarrhea and constipation.  Genitourinary: Negative for dysuria.  Musculoskeletal: Negative for myalgias.  Neurological: Positive for headaches. Negative for weakness.   Physical Exam   Blood  pressure 138/92, pulse 71, temperature 98.4 F (36.9 C), temperature source Oral, resp. rate 16, last menstrual period 09/30/2011.  Physical Exam  Constitutional: She is oriented to person, place, and time. She appears well-developed and well-nourished. No distress.  Cardiovascular: Normal rate.   Respiratory: Effort normal.  GI: Soft. She exhibits no distension. There is no tenderness. There is no rebound and no guarding.  Musculoskeletal: Normal range of motion. She exhibits no edema and no tenderness.  Neurological: She is alert and oriented to person, place, and time. She has normal reflexes.  Skin: Skin is warm and dry.  Psychiatric: She has a normal mood and affect.   FHR reactive No contractions MAU Course  Procedures Results for orders placed during the hospital encounter of 05/14/12 (from the past 24 hour(s))  CBC     Status: Abnormal   Collection Time    05/14/12  7:24 PM      Result Value Range   WBC 18.7 (*) 4.0 - 10.5 K/uL   RBC 4.39  3.87 - 5.11 MIL/uL   Hemoglobin 12.5  12.0 - 15.0 g/dL   HCT 40.9  81.1 - 91.4 %   MCV 85.4  78.0 - 100.0 fL   MCH 28.5  26.0 - 34.0 pg   MCHC 33.3  30.0 - 36.0  g/dL   RDW 40.9  81.1 - 91.4 %   Platelets 293  150 - 400 K/uL  COMPREHENSIVE METABOLIC PANEL     Status: Abnormal   Collection Time    05/14/12  7:24 PM      Result Value Range   Sodium 134 (*) 135 - 145 mEq/L   Potassium 4.4  3.5 - 5.1 mEq/L   Chloride 100  96 - 112 mEq/L   CO2 24  19 - 32 mEq/L   Glucose, Bld 80  70 - 99 mg/dL   BUN 13  6 - 23 mg/dL   Creatinine, Ser 7.82  0.50 - 1.10 mg/dL   Calcium 9.4  8.4 - 95.6 mg/dL   Total Protein 6.7  6.0 - 8.3 g/dL   Albumin 2.5 (*) 3.5 - 5.2 g/dL   AST 14  0 - 37 U/L   ALT 9  0 - 35 U/L   Alkaline Phosphatase 173 (*) 39 - 117 U/L   Total Bilirubin 0.2 (*) 0.3 - 1.2 mg/dL   GFR calc non Af Amer >90  >90 mL/min   GFR calc Af Amer >90  >90 mL/min  PROTEIN / CREATININE RATIO, URINE     Status: None   Collection Time     05/14/12  8:20 PM      Result Value Range   Creatinine, Urine 223.36     Total Protein, Urine 26.4     PROTEIN CREATININE RATIO 0.12  0.00 - 0.15   2020: Dr. Penne Lash updated. Plan to admit overnight with continuous monitoring and BMZ. Repeat BPP tomorrow.  Assessment and Plan  A:  SIUP at [redacted]w[redacted]d       Now reactive NST, BPP now 8/10      Gestational hypertension P:  PIH labs      Dr Penne Lash updated      Report to Zorita Pang CNM  Admit to ante overnight Continuous monitoring BMZ X 2 Repeat BPP tomorrow  [facility] 05/14/2012, 7:36 PM

## 2012-05-14 NOTE — H&P (Signed)
History     CSN: [id number]  Arrival date and time: 05/14/12 1811   None     No chief complaint on file.  HPI This is a 27 y.o. female at [redacted]w[redacted]d presents for NST following an equivocal NST in clinic with BPP 6/8 (2 off for breathing)  Also being followed for Gestational hypertension. Last labs a week ago. Has daily headaches. Fioricet not working.    RN Note: Pt sent to MAU from U/S, BPP 6/8. Pt denies bleeding or leaking, does have decreased FM. Has HA, seeing spots.  OB History   Grav Para Term Preterm Abortions TAB SAB Ect Mult Living   2    1  1          Past Medical History  Diagnosis Date  . Gallstones   . Medical history non-contributory   . Headache   . Pregnancy induced hypertension     Past Surgical History  Procedure Laterality Date  . Eye surgery    . Breast reduction surgery      Family History  Problem Relation Age of Onset  . Cancer Mother   . Diabetes Mother   . Hypertension Mother   . Diabetes Other     History  Substance Use Topics  . Smoking status: Never Smoker   . Smokeless tobacco: Never Used  . Alcohol Use: No    Allergies: No Known Allergies  Prescriptions prior to admission  Medication Sig Dispense Refill  . butalbital-acetaminophen-caffeine (FIORICET, ESGIC) 50-325-40 MG per tablet Take 1-2 tablets by mouth every 6 (six) hours as needed for headache.  20 tablet  0  . omeprazole (PRILOSEC) 20 MG capsule Take 20 mg by mouth daily as needed.      . Prenatal Vit-Fe Fumarate-FA (PRENATAL MULTIVITAMIN) TABS Take 1 tablet by mouth daily at 12 noon.        Review of Systems  Constitutional: Negative for fever, chills and malaise/fatigue.  Eyes: Positive for blurred vision.  Gastrointestinal: Negative for nausea, vomiting, abdominal pain, diarrhea and constipation.  Genitourinary: Negative for dysuria.  Musculoskeletal: Negative for myalgias.  Neurological: Positive for headaches. Negative for weakness.   Physical Exam   Blood  pressure 138/92, pulse 71, temperature 98.4 F (36.9 C), temperature source Oral, resp. rate 16, last menstrual period 09/30/2011.  Physical Exam  Constitutional: She is oriented to person, place, and time. She appears well-developed and well-nourished. No distress.  Cardiovascular: Normal rate.   Respiratory: Effort normal.  GI: Soft. She exhibits no distension. There is no tenderness. There is no rebound and no guarding.  Musculoskeletal: Normal range of motion. She exhibits no edema and no tenderness.  Neurological: She is alert and oriented to person, place, and time. She has normal reflexes.  Skin: Skin is warm and dry.  Psychiatric: She has a normal mood and affect.   FHR reactive No contractions MAU Course  Procedures Results for orders placed during the hospital encounter of 05/14/12 (from the past 24 hour(s))  CBC     Status: Abnormal   Collection Time    05/14/12  7:24 PM      Result Value Range   WBC 18.7 (*) 4.0 - 10.5 K/uL   RBC 4.39  3.87 - 5.11 MIL/uL   Hemoglobin 12.5  12.0 - 15.0 g/dL   HCT 40.9  81.1 - 91.4 %   MCV 85.4  78.0 - 100.0 fL   MCH 28.5  26.0 - 34.0 pg   MCHC 33.3  30.0 - 36.0  g/dL   RDW 16.1  09.6 - 04.5 %   Platelets 293  150 - 400 K/uL  COMPREHENSIVE METABOLIC PANEL     Status: Abnormal   Collection Time    05/14/12  7:24 PM      Result Value Range   Sodium 134 (*) 135 - 145 mEq/L   Potassium 4.4  3.5 - 5.1 mEq/L   Chloride 100  96 - 112 mEq/L   CO2 24  19 - 32 mEq/L   Glucose, Bld 80  70 - 99 mg/dL   BUN 13  6 - 23 mg/dL   Creatinine, Ser 4.09  0.50 - 1.10 mg/dL   Calcium 9.4  8.4 - 81.1 mg/dL   Total Protein 6.7  6.0 - 8.3 g/dL   Albumin 2.5 (*) 3.5 - 5.2 g/dL   AST 14  0 - 37 U/L   ALT 9  0 - 35 U/L   Alkaline Phosphatase 173 (*) 39 - 117 U/L   Total Bilirubin 0.2 (*) 0.3 - 1.2 mg/dL   GFR calc non Af Amer >90  >90 mL/min   GFR calc Af Amer >90  >90 mL/min  PROTEIN / CREATININE RATIO, URINE     Status: None   Collection Time     05/14/12  8:20 PM      Result Value Range   Creatinine, Urine 223.36     Total Protein, Urine 26.4     PROTEIN CREATININE RATIO 0.12  0.00 - 0.15   2020: Dr. Penne Lash updated. Plan to admit overnight with continuous monitoring and BMZ. Repeat BPP tomorrow.  Assessment and Plan  A:  SIUP at [redacted]w[redacted]d       Now reactive NST, BPP now 8/10      Gestational hypertension P:  PIH labs      Dr Penne Lash updated      Report to Zorita Pang CNM  Admit to ante overnight Continuous monitoring BMZ X 2 Repeat BPP tomorrow  Tawnya Crook

## 2012-05-14 NOTE — Progress Notes (Signed)
Decreased fetal movement. Visits to MAU 5/8, 5/9, 5/10 PIH r/o. BMX x2  Labs Nl NST 10/10, to MAU for BPP

## 2012-05-14 NOTE — MAU Provider Note (Signed)
Attestation of Attending Supervision of Advanced Practitioner (CNM/NP)/Fellow: Evaluation and management procedures were performed by the Advanced Practitioner under my supervision and collaboration.  I have reviewed the Advanced Practitioner's note and chart, and I agree with the management and plan.  HARRAWAY-SMITH, Zohra Clavel 10:52 AM

## 2012-05-14 NOTE — Patient Instructions (Signed)

## 2012-05-14 NOTE — MAU Note (Signed)
Pt sent to MAU from U/S, BPP 6/8.  Pt denies bleeding or leaking, does have decreased FM.  Has HA, seeing spots.

## 2012-05-15 ENCOUNTER — Inpatient Hospital Stay (HOSPITAL_COMMUNITY): Payer: Medicaid Other

## 2012-05-15 ENCOUNTER — Encounter (HOSPITAL_COMMUNITY): Payer: Self-pay | Admitting: *Deleted

## 2012-05-15 DIAGNOSIS — O368190 Decreased fetal movements, unspecified trimester, not applicable or unspecified: Secondary | ICD-10-CM

## 2012-05-15 DIAGNOSIS — O139 Gestational [pregnancy-induced] hypertension without significant proteinuria, unspecified trimester: Principal | ICD-10-CM

## 2012-05-15 DIAGNOSIS — R51 Headache: Secondary | ICD-10-CM

## 2012-05-15 NOTE — Progress Notes (Signed)
UR completed 

## 2012-05-15 NOTE — Progress Notes (Signed)
Patient ID: Jill Reeves, female   DOB: [birth year], 27 y.o.   MRN: [id number] FACULTY PRACTICE ANTEPARTUM COMPREHENSIVE PROGRESS NOTE  Jill Reeves is a 27 y.o. G2P0010 at [redacted]w[redacted]d  who is admitted for non-reactive NST and 6/8 BPP.  Estimated Date of Delivery: 07/06/12 Fetal presentation is cephalic.  Length of Stay:  1 Days. 05/14/2012  Subjective: Patient reports good fetal movement.  She reports only occasional mild uterine contraction, no bleeding and no loss of fluid per vagina. Continued headache, partially relieved by Percocet. Blurred vision. No RUQ pain.   Vitals:  Blood pressure 144/92, pulse 71, temperature 97.9 F (36.6 C), temperature source Oral, resp. rate 20, last menstrual period 09/30/2011.  Filed Vitals:   05/14/12 2047 05/14/12 2102 05/14/12 2117 05/14/12 2356  BP: 124/79 132/87 144/83 144/92  Pulse: 92 83 99 71  Temp:    97.9 F (36.6 C)  TempSrc:    Oral  Resp:    20    Physical Examination: GEN:  Alert, no distress CV:  RRR, no murmur RESP:  CTAB ABD:  Non-tender Cervical Exam: Not evaluated. and found to be  Extremities: extremities normal, atraumatic, no cyanosis or edema  Membranes:intact  Fetal Monitoring:  Baseline: 140 bpm, Variability: Good {> 6 bpm), Accelerations: reactive at 5:30 AM, otherwise not quite reactive - no 15x15 accels and Decelerations: Absent  Labs:  Results for orders placed during the hospital encounter of 05/14/12 (from the past 24 hour(s))  CBC   Collection Time    05/14/12  7:24 PM      Result Value Range   WBC 18.7 (*) 4.0 - 10.5 K/uL   RBC 4.39  3.87 - 5.11 MIL/uL   Hemoglobin 12.5  12.0 - 15.0 g/dL   HCT 29.5  62.1 - 30.8 %   MCV 85.4  78.0 - 100.0 fL   MCH 28.5  26.0 - 34.0 pg   MCHC 33.3  30.0 - 36.0 g/dL   RDW 65.7  84.6 - 96.2 %   Platelets 293  150 - 400 K/uL  COMPREHENSIVE METABOLIC PANEL   Collection Time    05/14/12  7:24 PM      Result Value Range   Sodium 134 (*) 135 - 145 mEq/L   Potassium 4.4  3.5 -  5.1 mEq/L   Chloride 100  96 - 112 mEq/L   CO2 24  19 - 32 mEq/L   Glucose, Bld 80  70 - 99 mg/dL   BUN 13  6 - 23 mg/dL   Creatinine, Ser 9.52  0.50 - 1.10 mg/dL   Calcium 9.4  8.4 - 84.1 mg/dL   Total Protein 6.7  6.0 - 8.3 g/dL   Albumin 2.5 (*) 3.5 - 5.2 g/dL   AST 14  0 - 37 U/L   ALT 9  0 - 35 U/L   Alkaline Phosphatase 173 (*) 39 - 117 U/L   Total Bilirubin 0.2 (*) 0.3 - 1.2 mg/dL   GFR calc non Af Amer >90  >90 mL/min   GFR calc Af Amer >90  >90 mL/min  PROTEIN / CREATININE RATIO, URINE   Collection Time    05/14/12  8:20 PM      Result Value Range   Creatinine, Urine 223.36     Total Protein, Urine 26.4     PROTEIN CREATININE RATIO 0.12  0.00 - 0.15  POCT URINALYSIS DIP (DEVICE)   Collection Time    05/14/12  3:10 PM      Result  Value Range   Glucose, UA NEGATIVE  NEGATIVE mg/dL   Bilirubin Urine NEGATIVE  NEGATIVE   Ketones, ur NEGATIVE  NEGATIVE mg/dL   Specific Gravity, Urine 1.020  1.005 - 1.030   Hgb urine dipstick TRACE (*) NEGATIVE   pH 6.0  5.0 - 8.0   Protein, ur 30 (*) NEGATIVE mg/dL   Urobilinogen, UA 0.2  0.0 - 1.0 mg/dL   Nitrite NEGATIVE  NEGATIVE   Leukocytes, UA SMALL (*) NEGATIVE    Imaging Studies:    BPP pending today   Medications:  Scheduled . docusate sodium  100 mg Oral Daily  . prenatal multivitamin  1 tablet Oral Q1200   I have reviewed the patient's current medications.  ASSESSMENT: Patient Active Problem List   Diagnosis Date Noted  . Hypertension in pregnancy, preeclampsia 05/10/2012  Non-reactive NST and 6/8 BPP  PLAN: Repeat BPP today Has had 20 min reactive strip at 5:30 am but no further 15x15 accels BP normal to moderate range (high of 153/103) PIH labs normal S/p BMZ last week Continue routine antenatal care.  Napoleon Form, MD

## 2012-05-15 NOTE — Discharge Summary (Signed)
Antenatal Physician Discharge Summary  Patient ID: Jill Reeves MRN: [id number] DOB/AGE: 1985-01-15 27 y.o.  Admit date: 05/14/2012 Discharge date: 05/15/2012  Admission Diagnoses:  IUP at [redacted]w[redacted]d, Gestational Hypertension, Non-reactive NST with 6/8 BPP  Discharge Diagnoses: Same  Prenatal Procedures: BPP, NST  Significant Diagnostic Studies:  Results for orders placed during the hospital encounter of 05/14/12 (from the past 168 hour(s))  CBC   Collection Time    05/14/12  7:24 PM      Result Value Range   WBC 18.7 (*) 4.0 - 10.5 K/uL   RBC 4.39  3.87 - 5.11 MIL/uL   Hemoglobin 12.5  12.0 - 15.0 g/dL   HCT 14.7  82.9 - 56.2 %   MCV 85.4  78.0 - 100.0 fL   MCH 28.5  26.0 - 34.0 pg   MCHC 33.3  30.0 - 36.0 g/dL   RDW 13.0  86.5 - 78.4 %   Platelets 293  150 - 400 K/uL  COMPREHENSIVE METABOLIC PANEL   Collection Time    05/14/12  7:24 PM      Result Value Range   Sodium 134 (*) 135 - 145 mEq/L   Potassium 4.4  3.5 - 5.1 mEq/L   Chloride 100  96 - 112 mEq/L   CO2 24  19 - 32 mEq/L   Glucose, Bld 80  70 - 99 mg/dL   BUN 13  6 - 23 mg/dL   Creatinine, Ser 6.96  0.50 - 1.10 mg/dL   Calcium 9.4  8.4 - 29.5 mg/dL   Total Protein 6.7  6.0 - 8.3 g/dL   Albumin 2.5 (*) 3.5 - 5.2 g/dL   AST 14  0 - 37 U/L   ALT 9  0 - 35 U/L   Alkaline Phosphatase 173 (*) 39 - 117 U/L   Total Bilirubin 0.2 (*) 0.3 - 1.2 mg/dL   GFR calc non Af Amer >90  >90 mL/min   GFR calc Af Amer >90  >90 mL/min  PROTEIN / CREATININE RATIO, URINE   Collection Time    05/14/12  8:20 PM      Result Value Range   Creatinine, Urine 223.36     Total Protein, Urine 26.4     PROTEIN CREATININE RATIO 0.12  0.00 - 0.15  Results for orders placed in visit on 05/14/12 (from the past 168 hour(s))  POCT URINALYSIS DIP (DEVICE)   Collection Time    05/14/12  3:10 PM      Result Value Range   Glucose, UA NEGATIVE  NEGATIVE mg/dL   Bilirubin Urine NEGATIVE  NEGATIVE   Ketones, ur NEGATIVE  NEGATIVE mg/dL   Specific Gravity, Urine 1.020  1.005 - 1.030   Hgb urine dipstick TRACE (*) NEGATIVE   pH 6.0  5.0 - 8.0   Protein, ur 30 (*) NEGATIVE mg/dL   Urobilinogen, UA 0.2  0.0 - 1.0 mg/dL   Nitrite NEGATIVE  NEGATIVE   Leukocytes, UA SMALL (*) NEGATIVE  PROTEIN, URINE, 24 HOUR   Collection Time    05/10/12  7:25 PM      Result Value Range   Urine Total Volume-UPROT 1300     Collection Interval-UPROT 24     Protein, Urine 17     Protein, 24H Urine 221 (*) 50 - 100 mg/day   ULTRASOUND  05/14/12  11:08 PM BIOPHYSICAL PROFILE  Number of Fetuses: 1  Heart Rate: 135 bpm  Presentation: Cephalic  Amniotic Fluid (Subjective): Normal.  Vertical pocket: 6.1cm  BPP:  Movement: Present Time: 30  Breathing: Not observed  Tone: Normal  Amniotic Fluid: Normal  Total Score: 6/8  IMPRESSION:  BPP remains 6/8 with breathing not observed.    Hospital Course:  This is a 27 y.o. G2P0010 with IUP at [redacted]w[redacted]d admitted for 6/10 BPP. Patient has gestational hypertension and has been in antenatal testing for the past week. She had an equivocal/non-reactive NST in clinic and was sent for BPP, which was 6/8- two points off for no fetal breathing. Due to her elevated BP, she was admitted for observation, monitoring and PIH work-up. PIH labs were negative:  Normal platelets and LFTs and urine protein:creatinine reatio of 0.12.  She continued to have mild headache and blurred vision but this was not new. Her repeat BPP on the morning following admission was 6/8, again -2 for no fetal breathing. Her FHTs showed good variability and an occasional 15x15 acceleration (rare). However, there were no decelerations and the tracing often reached 10x10 and close to 15x15 acceleration. A repeat BPP at 4 PM on day of discharge was 8/8.   The patient had already received two doses of betamethasone at 31 weeks. She had no contractions, bleeding, or loss of fluid during her hospital stay, and fetal movement was good. She was deemed  stable for discharge to home with outpatient follow up.  Discharge Exam: BP 110/69  Pulse 97  Temp(Src) 98.3 F (36.8 C) (Oral)  Resp 24  Ht 5' (1.524 m)  Wt 81.647 kg (180 lb)  BMI 35.15 kg/m2  LMP 09/30/2011  GEN:  WNWD, no distress HEENT:  NCAT, EOMI, conjunctiva clear NECK:  Supple, non-tender, no thyromegaly, trachea midline CV: RRR, no murmur RESP:  CTAB ABD:  Soft, non-tender, no guarding or rebound, normal bowel sounds EXTREM:  Warm, well perfused, no edema or tenderness NEURO:  Alert, oriented, no focal deficits GU:  Deferred  Discharge Condition: Stable  Disposition: 01-Home or Self Care  Discharge Orders   Future Orders Complete By Expires     Discharge patient  As directed     Comments:      To home        Medication List    TAKE these medications       butalbital-acetaminophen-caffeine 50-325-40 MG per tablet  Commonly known as:  FIORICET, ESGIC  Take 1-2 tablets by mouth every 6 (six) hours as needed for headache.     omeprazole 20 MG capsule  Commonly known as:  PRILOSEC  Take 20 mg by mouth daily as needed.     prenatal multivitamin Tabs  Take 1 tablet by mouth daily at 12 noon.           Follow-up Information   Call [facility]. (If you do not hear from the office tomorrow morning, call to schedule your next appointment for NST (fetal monitoring) and prenatal check-up.)    Contact information:   [address] [place] [address] [phone]      Signed: Napoleon Form M.D. 05/15/2012, 4:41 PM

## 2012-05-16 ENCOUNTER — Telehealth: Payer: Self-pay | Admitting: *Deleted

## 2012-05-16 NOTE — Telephone Encounter (Addendum)
Message copied by Jill Side on Wed May 16, 2012 10:22 AM ------      Message from: Norina Cowper L      Created: Wed May 16, 2012  7:37 AM      Regarding: FW: needs f/u up appointments and resume twice weekly testing                   ----- Message -----         From: Napoleon Form, MD         Sent: 05/15/2012   4:38 PM           To: [facility]      Subject: needs f/u up appointments and resume twice w#            Pt admitted for NRNST and 6/8 BPP in clinic 5/12. D/C home 5/13. Did not get appt set up for continued antenatal testing and next prenatal visit.  ------Called pt and left message that I need her to call back and speak with me regarding appt for tomorrow 5/15.

## 2012-05-16 NOTE — Telephone Encounter (Signed)
Called Jill Reeves and left a message that she has an appointment for tomorrow 04/17/12 at 1:45 to see the doctor and for a NST. Call if you have any questions.

## 2012-05-17 ENCOUNTER — Ambulatory Visit (INDEPENDENT_AMBULATORY_CARE_PROVIDER_SITE_OTHER): Payer: BC Managed Care – PPO | Admitting: Obstetrics & Gynecology

## 2012-05-17 VITALS — BP 143/88

## 2012-05-17 DIAGNOSIS — IMO0002 Reserved for concepts with insufficient information to code with codable children: Secondary | ICD-10-CM

## 2012-05-17 DIAGNOSIS — O368190 Decreased fetal movements, unspecified trimester, not applicable or unspecified: Secondary | ICD-10-CM

## 2012-05-17 NOTE — MAU Provider Note (Signed)
Attestation of Attending Supervision of Advanced Practitioner (CNM/NP): Evaluation and management procedures were performed by the Advanced Practitioner under my supervision and collaboration. I have reviewed the Advanced Practitioner's note and chart, and I agree with the management and plan.  Aikeem Lilley H. 3:31 PM   

## 2012-05-17 NOTE — Progress Notes (Signed)
P = 92   Pt continues to have some blurry vision and occasional headaches. Takes Fioricet for the H/A which helps but does not take it away completely.  Pt had been admitted for 23 hr Obs on 5/12 due to NR NST and BPP of 6/8. During hospital course, NST was reactive and repeat BPP on 5/13 was 8/8. Pt was d/c on 5/13. She reports continued decreased FM since that time. NST today is reactive w/good FM present and pt is aware of some but not all FM.

## 2012-05-17 NOTE — Patient Instructions (Signed)

## 2012-05-17 NOTE — MAU Provider Note (Signed)
Attestation of Attending Supervision of Advanced Practitioner (CNM/NP): Evaluation and management procedures were performed by the Advanced Practitioner under my supervision and collaboration. I have reviewed the Advanced Practitioner's note and chart, and I agree with the management and plan.  LEGGETT,KELLY H. 3:31 PM

## 2012-05-17 NOTE — H&P (Signed)
NST became nonreactive and decreased variability. Admit to ante  Agree with above note.  Ludia Gartland H. 05/17/2012 3:30 PM

## 2012-05-17 NOTE — Progress Notes (Signed)
NST reactive today. Hosptalized 5/12 for NR NST BPP 6/8, with reassuring surveillance in f/u. Normal labs   F/U US next week

## 2012-05-22 ENCOUNTER — Ambulatory Visit (HOSPITAL_COMMUNITY)
Admission: RE | Admit: 2012-05-22 | Discharge: 2012-05-22 | Disposition: A | Discharge status: Home / Self Care | Payer: Medicaid Other | Source: Ambulatory Visit | Attending: Obstetrics & Gynecology | Admitting: Obstetrics & Gynecology

## 2012-05-22 ENCOUNTER — Inpatient Hospital Stay (HOSPITAL_COMMUNITY)
Admission: AD | Admit: 2012-05-22 | Discharge: 2012-05-27 | DRG: 765 | Disposition: A | Discharge status: Home / Self Care | Payer: Medicaid Other | Source: Ambulatory Visit | Attending: Obstetrics and Gynecology | Admitting: Obstetrics and Gynecology

## 2012-05-22 ENCOUNTER — Ambulatory Visit (INDEPENDENT_AMBULATORY_CARE_PROVIDER_SITE_OTHER): Payer: BC Managed Care – PPO | Admitting: *Deleted

## 2012-05-22 ENCOUNTER — Encounter (HOSPITAL_COMMUNITY): Payer: Self-pay | Admitting: *Deleted

## 2012-05-22 ENCOUNTER — Inpatient Hospital Stay (HOSPITAL_COMMUNITY): Payer: Medicaid Other

## 2012-05-22 VITALS — BP 165/99

## 2012-05-22 DIAGNOSIS — Z98891 History of uterine scar from previous surgery: Secondary | ICD-10-CM

## 2012-05-22 DIAGNOSIS — O1490 Unspecified pre-eclampsia, unspecified trimester: Secondary | ICD-10-CM | POA: Diagnosis present

## 2012-05-22 DIAGNOSIS — IMO0002 Reserved for concepts with insufficient information to code with codable children: Secondary | ICD-10-CM

## 2012-05-22 DIAGNOSIS — O368190 Decreased fetal movements, unspecified trimester, not applicable or unspecified: Secondary | ICD-10-CM

## 2012-05-22 DIAGNOSIS — O1414 Severe pre-eclampsia complicating childbirth: Principal | ICD-10-CM | POA: Diagnosis present

## 2012-05-22 DIAGNOSIS — O133 Gestational [pregnancy-induced] hypertension without significant proteinuria, third trimester: Secondary | ICD-10-CM

## 2012-05-22 DIAGNOSIS — O139 Gestational [pregnancy-induced] hypertension without significant proteinuria, unspecified trimester: Secondary | ICD-10-CM

## 2012-05-22 LAB — URINALYSIS, ROUTINE W REFLEX MICROSCOPIC
Bilirubin Urine: NEGATIVE
Glucose, UA: NEGATIVE mg/dL
Ketones, ur: NEGATIVE mg/dL
Protein, ur: 30 mg/dL — AB

## 2012-05-22 LAB — COMPREHENSIVE METABOLIC PANEL
ALT: 54 U/L — ABNORMAL HIGH (ref 0–35)
AST: 65 U/L — ABNORMAL HIGH (ref 0–37)
Albumin: 2.3 g/dL — ABNORMAL LOW (ref 3.5–5.2)
Alkaline Phosphatase: 202 U/L — ABNORMAL HIGH (ref 39–117)
Chloride: 102 mEq/L (ref 96–112)
Potassium: 4.3 mEq/L (ref 3.5–5.1)
Total Bilirubin: 0.3 mg/dL (ref 0.3–1.2)

## 2012-05-22 LAB — CBC
Platelets: 195 10*3/uL (ref 150–400)
RBC: 4.34 MIL/uL (ref 3.87–5.11)
RDW: 14.5 % (ref 11.5–15.5)
WBC: 14.8 10*3/uL — ABNORMAL HIGH (ref 4.0–10.5)

## 2012-05-22 LAB — TYPE AND SCREEN
ABO/RH(D): O POS
Antibody Screen: NEGATIVE

## 2012-05-22 MED ORDER — ACETAMINOPHEN 325 MG PO TABS
650.0000 mg | ORAL_TABLET | ORAL | Status: DC | PRN
  Administered 2012-05-22 – 2012-05-23 (×2): 650 mg via ORAL
  Filled 2012-05-22 (×2): qty 2

## 2012-05-22 MED ORDER — PRENATAL MULTIVITAMIN CH
1.0000 | ORAL_TABLET | Freq: Every day | ORAL | Status: DC
  Administered 2012-05-22 – 2012-05-23 (×2): 1 via ORAL
  Filled 2012-05-22 (×2): qty 1

## 2012-05-22 MED ORDER — DOCUSATE SODIUM 100 MG PO CAPS
100.0000 mg | ORAL_CAPSULE | Freq: Every day | ORAL | Status: DC
  Administered 2012-05-22 – 2012-05-23 (×2): 100 mg via ORAL
  Filled 2012-05-22 (×2): qty 1

## 2012-05-22 MED ORDER — HYDRALAZINE HCL 20 MG/ML IJ SOLN: 5.0000 mg | Freq: Four times a day (QID) | INTRAMUSCULAR | Status: DC

## 2012-05-22 MED ORDER — CALCIUM CARBONATE ANTACID 500 MG PO CHEW: 2.0000 | CHEWABLE_TABLET | ORAL | Status: DC | PRN

## 2012-05-22 MED ORDER — PANTOPRAZOLE SODIUM 40 MG PO TBEC
40.0000 mg | DELAYED_RELEASE_TABLET | Freq: Every day | ORAL | Status: DC
  Administered 2012-05-22 – 2012-05-23 (×2): 40 mg via ORAL
  Filled 2012-05-22 (×3): qty 1

## 2012-05-22 MED ORDER — ZOLPIDEM TARTRATE 5 MG PO TABS
5.0000 mg | ORAL_TABLET | Freq: Every evening | ORAL | Status: DC | PRN
  Administered 2012-05-22: 5 mg via ORAL
  Filled 2012-05-22: qty 1

## 2012-05-22 MED ORDER — LABETALOL HCL 200 MG PO TABS
400.0000 mg | ORAL_TABLET | Freq: Two times a day (BID) | ORAL | Status: DC
  Administered 2012-05-22 – 2012-05-23 (×2): 400 mg via ORAL
  Filled 2012-05-22 (×4): qty 2

## 2012-05-22 MED ORDER — METHYLDOPA 250 MG PO TABS
250.0000 mg | ORAL_TABLET | Freq: Two times a day (BID) | ORAL | Status: DC
  Administered 2012-05-22: 250 mg via ORAL
  Filled 2012-05-22 (×3): qty 1

## 2012-05-22 MED ORDER — LACTATED RINGERS IV SOLN
INTRAVENOUS | Status: DC
  Administered 2012-05-22 – 2012-05-23 (×3): via INTRAVENOUS

## 2012-05-22 MED ORDER — HYDRALAZINE HCL 20 MG/ML IJ SOLN
5.0000 mg | Freq: Once | INTRAMUSCULAR | Status: AC
  Administered 2012-05-22: 5 mg via INTRAVENOUS
  Filled 2012-05-22: qty 1

## 2012-05-22 NOTE — H&P (Signed)
Jill Reeves is a 27 y.o. G2P0010 at [redacted]w[redacted]d with history of gestational HTN who presents from clinic with elevated blood pressures and complaints of headache.   Patient reports that her BP was significantly elevated in the clinic today (SBP in the 170's) and she had a nonreactive NST as well. She was subsequently sent for further evaluation. Patient reports frequent, daily, constant headache with associated visual "spotting." She reports that this has been going on for the last 2 weeks. She also reports that this has continued despite PO Floricet.    History OB History   Grav Para Term Preterm Abortions TAB SAB Ect Mult Living   2    1  1         Past Medical History  Diagnosis Date  . Gallstones   . Medical history non-contributory   . Headache   . Pregnancy induced hypertension   . Asthma    Past Surgical History  Procedure Laterality Date  . Eye surgery    . Breast reduction surgery    . Dilation and curettage of uterus     Family History: family history includes Anemia in her sister; Asthma in her brother and sister; Cancer in her maternal grandmother and mother; Diabetes in her mother and other; and Hypertension in her mother. Social History:  reports that she has never smoked. She has never used smokeless tobacco. She reports that she does not drink alcohol or use illicit drugs.   Prenatal Transfer Tool  Maternal Diabetes: No Genetic Screening: Normal Maternal Ultrasounds/Referrals: Normal Maternal Substance Abuse:  No Significant Maternal Medications:  Meds include Floricet. Significant Maternal Lab Results:  None Other Comments:  Pre-eclampsia.  ROS Constitutional: Negative for fever and chills.  Eyes:  Reports that she she's spots.  Respiratory: Negative for shortness of breath.  Cardiovascular: Negative for chest pain.  Gastrointestinal: Negative for nausea, vomiting and abdominal pain.  Neurological: Positive for headaches.    Blood pressure 174/109, pulse 86,  temperature 97.6 F (36.4 C), resp. rate 18, height 5' (1.524 m), weight 83.28 kg (183 lb 9.6 oz), last menstrual period 09/30/2011, SpO2 100.00%. Exam Physical Exam  Prenatal labs: ABO, Rh: O/Positive/-- (01/29 0000) Antibody: Negative (01/29 0000) Rubella: Immune (01/29 0000) RPR: Nonreactive, Nonreactive (01/29 0000)  HBsAg: Negative (01/29 0000)  HIV: Non-reactive, Non-reactive (01/29 0000)  GBS:     Assessment/Plan: 27 y.o. G2P0010 at [redacted]w[redacted]d with history of gestational HTN who presents with elevated BP (170's/100's-110's), headache, and visual changes.   - Will admit to Antepartum  - IV Hydralazine x 1. Will follow up BP's and transition to PO if able  - Will order CBC, CMP, 24 hour urine protein to evaluate for pre-eclampsia.  BPP as well. - Will monitor closely.  Everlene Other 05/22/2012, 12:00 PM  I was present for the exam and agree with above.  [place], CNM 05/23/2012 11:48 AM

## 2012-05-22 NOTE — MAU Provider Note (Signed)
History     CSN: [id number]  Arrival date and time: 05/22/12 1049  First seen by provider at 1130    Chief Complaint  Patient presents with  . Hypertension   HPI Jill Reeves is a 27 y.o. G2P0010 at [redacted]w[redacted]d with history of gestational HTN who presents from clinic with elevated blood pressures and complaints of headache.  Patient reports that her BP was significantly elevated in the clinic today (SBP in the 170's) and she had a nonreactive NST as well.  She was subsequently sent for further evaluation.  Patient reports frequent, daily, constant headache with associated visual "spotting."  She reports that this has been going on for the last 2 weeks.  She also reports that this has continued despite PO Floricet.  Past Medical History  Diagnosis Date  . Gallstones   . Medical history non-contributory   . Headache   . Pregnancy induced hypertension   . Asthma     Past Surgical History  Procedure Laterality Date  . Eye surgery    . Breast reduction surgery    . Dilation and curettage of uterus      Family History  Problem Relation Age of Onset  . Cancer Mother   . Diabetes Mother   . Hypertension Mother   . Diabetes Other   . Anemia Sister   . Asthma Sister   . Asthma Brother   . Cancer Maternal Grandmother     bladder cancer    History  Substance Use Topics  . Smoking status: Never Smoker   . Smokeless tobacco: Never Used  . Alcohol Use: No    Allergies: No Known Allergies  Prescriptions prior to admission  Medication Sig Dispense Refill  . acetaminophen (TYLENOL) 325 MG tablet Take 975 mg by mouth every 6 (six) hours as needed for pain.      . butalbital-acetaminophen-caffeine (FIORICET, ESGIC) 50-325-40 MG per tablet Take 1-2 tablets by mouth every 6 (six) hours as needed for headache.  20 tablet  0  . omeprazole (PRILOSEC) 20 MG capsule Take 20 mg by mouth daily as needed.      . Prenatal Vit-Fe Fumarate-FA (PRENATAL MULTIVITAMIN) TABS Take 1 tablet by  mouth daily at 12 noon.        Review of Systems  Constitutional: Negative for fever and chills.  Eyes:       Reports that she she's spots.  Respiratory: Negative for shortness of breath.   Cardiovascular: Negative for chest pain.  Gastrointestinal: Negative for nausea, vomiting and abdominal pain.  Neurological: Positive for headaches.    Physical Exam   Blood pressure 174/109, pulse 86, temperature 97.6 F (36.4 C), resp. rate 18, height 5' (1.524 m), weight 83.28 kg (183 lb 9.6 oz), last menstrual period 09/30/2011, SpO2 100.00%.  Physical Exam Gen: well appearing, resting comfortably. Heart: RRR. No m/r/g. Lungs: CTAB. Abd: gravid but otherwise soft, nontender to palpation Ext: no appreciable lower extremity edema bilaterally Neuro: No focal deficits.  FHR: baseline 145, mod variability, 10X10  accels, none decels Toco: Occasional     MAU Course  Procedures   Assessment and Plan  27 y.o. G2P0010 at [redacted]w[redacted]d with history of gestational HTN who presents with elevated BP (170's/100's-110's), headache, and visual changes.  - Will admit to Antepartum - IV Hydralazine x 1.  Will follow up BP's and transition to PO if able - Will order CBC, CMP, 24 hour urine protein to evaluate for pre-eclampsia - Will monitor closely.  Everlene Other 05/22/2012,  11:43 AM   I was present for the exam and agree with above.  [place], CNM 05/23/2012 11:47 AM

## 2012-05-22 NOTE — Progress Notes (Signed)
NST non-reactive. Patient sent to MAU for further evaluation in view of elevated BP and NRNST

## 2012-05-22 NOTE — Progress Notes (Signed)
Bradly Chris RN in antenatal called. Due to recently receiving IV apresoline, pt will come to antenatal first and then to u/s. Joana Reamer RN called report to antenatal earlier

## 2012-05-22 NOTE — Progress Notes (Signed)
Ivonne Andrew CNM in unit when pt arrived and aware of arrival and reason sent up from clinic

## 2012-05-22 NOTE — Progress Notes (Signed)
P = 92  [place] for growth done today.  Dr. Jolayne Panther informed of NST results and BP readings.  Report given to [place], [place].  Pt sent to MAU for further evaluation.

## 2012-05-22 NOTE — MAU Note (Signed)
Pt came up from clinic. Had nonreactive NST and elevated B/Ps. Having some upper back pain and discomfort when breathes in.

## 2012-05-23 ENCOUNTER — Ambulatory Visit (HOSPITAL_COMMUNITY): Payer: Medicaid Other

## 2012-05-23 ENCOUNTER — Inpatient Hospital Stay (HOSPITAL_COMMUNITY): Payer: Medicaid Other

## 2012-05-23 ENCOUNTER — Encounter (HOSPITAL_COMMUNITY): Payer: Self-pay | Admitting: *Deleted

## 2012-05-23 LAB — PROTEIN, URINE, 24 HOUR
Protein, 24H Urine: 299 mg/d — ABNORMAL HIGH (ref 50–100)
Urine Total Volume-UPROT: 2300 mL

## 2012-05-23 LAB — ABO/RH: ABO/RH(D): O POS

## 2012-05-23 MED ORDER — ONDANSETRON HCL 4 MG/2ML IJ SOLN
4.0000 mg | Freq: Once | INTRAMUSCULAR | Status: AC
  Administered 2012-05-23: 4 mg via INTRAVENOUS
  Filled 2012-05-23: qty 2

## 2012-05-23 MED ORDER — TERBUTALINE SULFATE 1 MG/ML IJ SOLN: 0.2500 mg | Freq: Once | INTRAMUSCULAR | Status: DC | PRN

## 2012-05-23 MED ORDER — OXYCODONE-ACETAMINOPHEN 5-325 MG PO TABS: 1.0000 | ORAL_TABLET | ORAL | Status: DC | PRN

## 2012-05-23 MED ORDER — MAGNESIUM SULFATE BOLUS VIA INFUSION
4.0000 g | Freq: Once | INTRAVENOUS | Status: AC
  Administered 2012-05-24: 4 g via INTRAVENOUS
  Filled 2012-05-23: qty 500

## 2012-05-23 MED ORDER — LABETALOL HCL 200 MG PO TABS
400.0000 mg | ORAL_TABLET | Freq: Two times a day (BID) | ORAL | Status: DC
  Administered 2012-05-23 – 2012-05-24 (×3): 400 mg via ORAL
  Filled 2012-05-23 (×5): qty 2

## 2012-05-23 MED ORDER — ACETAMINOPHEN 325 MG PO TABS: 650.0000 mg | ORAL_TABLET | ORAL | Status: DC | PRN

## 2012-05-23 MED ORDER — IBUPROFEN 600 MG PO TABS: 600.0000 mg | ORAL_TABLET | Freq: Four times a day (QID) | ORAL | Status: DC | PRN

## 2012-05-23 MED ORDER — MAGNESIUM SULFATE 40 G IN LACTATED RINGERS - SIMPLE
2.0000 g/h | INTRAVENOUS | Status: DC
  Administered 2012-05-24: 2 g/h via INTRAVENOUS
  Filled 2012-05-23 (×2): qty 500

## 2012-05-23 MED ORDER — MAGNESIUM SULFATE 40 G IN LACTATED RINGERS - SIMPLE
1.0000 g/h | INTRAVENOUS | Status: DC
  Filled 2012-05-23: qty 500

## 2012-05-23 MED ORDER — TERBUTALINE SULFATE 1 MG/ML IJ SOLN: 0.2500 mg | Freq: Once | INTRAMUSCULAR | Status: AC | PRN

## 2012-05-23 MED ORDER — ONDANSETRON HCL 4 MG/2ML IJ SOLN
4.0000 mg | Freq: Four times a day (QID) | INTRAMUSCULAR | Status: DC | PRN
  Administered 2012-05-23: 4 mg via INTRAVENOUS
  Filled 2012-05-23: qty 2

## 2012-05-23 MED ORDER — OXYTOCIN 40 UNITS IN LACTATED RINGERS INFUSION - SIMPLE MED
INTRAVENOUS | Status: AC
  Administered 2012-05-23: 2 m[IU]/min via INTRAVENOUS
  Filled 2012-05-23: qty 1000

## 2012-05-23 MED ORDER — BUTALBITAL-APAP-CAFFEINE 50-325-40 MG PO TABS
2.0000 | ORAL_TABLET | ORAL | Status: DC | PRN
Start: 2012-05-23 — End: 2012-05-24
  Administered 2012-05-23: 2 via ORAL
  Filled 2012-05-23: qty 2

## 2012-05-23 MED ORDER — LIDOCAINE HCL (PF) 1 % IJ SOLN: 30.0000 mL | INTRAMUSCULAR | Status: DC | PRN

## 2012-05-23 MED ORDER — CITRIC ACID-SODIUM CITRATE 334-500 MG/5ML PO SOLN: 30.0000 mL | ORAL | Status: DC | PRN

## 2012-05-23 MED ORDER — OXYTOCIN 40 UNITS IN LACTATED RINGERS INFUSION - SIMPLE MED
62.5000 mL/h | INTRAVENOUS | Status: DC
  Administered 2012-05-24: 40 [IU] via INTRAVENOUS

## 2012-05-23 MED ORDER — MISOPROSTOL 25 MCG QUARTER TABLET
25.0000 ug | ORAL_TABLET | ORAL | Status: DC
  Administered 2012-05-23: 25 ug via VAGINAL
  Filled 2012-05-23: qty 0.25

## 2012-05-23 MED ORDER — OXYTOCIN 40 UNITS IN LACTATED RINGERS INFUSION - SIMPLE MED: 1.0000 m[IU]/min | INTRAVENOUS | Status: DC

## 2012-05-23 MED ORDER — ZOLPIDEM TARTRATE 5 MG PO TABS
5.0000 mg | ORAL_TABLET | Freq: Every evening | ORAL | Status: DC | PRN
  Administered 2012-05-23: 5 mg via ORAL
  Filled 2012-05-23: qty 1

## 2012-05-23 MED ORDER — MAGNESIUM SULFATE BOLUS VIA INFUSION
4.0000 g | Freq: Once | INTRAVENOUS | Status: DC
  Filled 2012-05-23: qty 500

## 2012-05-23 MED ORDER — LACTATED RINGERS IV SOLN
INTRAVENOUS | Status: DC
  Administered 2012-05-24: 03:00:00 via INTRAVENOUS

## 2012-05-23 MED ORDER — LACTATED RINGERS IV SOLN
500.0000 mL | INTRAVENOUS | Status: DC | PRN
  Administered 2012-05-24: 500 mL via INTRAVENOUS

## 2012-05-23 MED ORDER — OXYTOCIN BOLUS FROM INFUSION: 500.0000 mL | INTRAVENOUS | Status: DC

## 2012-05-23 NOTE — Progress Notes (Signed)
Shaw CNM updated on FHR and UC pattern.  Clelia Croft to report to department now.

## 2012-05-23 NOTE — Progress Notes (Signed)
Vomited approx 700 cc undigested food. Bright red blood noted in emesis basis and in the trash can. Threasa Heads, CNM asked to evaluate pt and Dr Debroah Loop on the unit and notified. Will cont to monitor.

## 2012-05-23 NOTE — MAU Provider Note (Signed)
Attestation of Attending Supervision of Advanced Practitioner: Evaluation and management procedures were performed by the PA/NP/CNM/OB Fellow under my supervision/collaboration. Chart reviewed and agree with management and plan.  Tatelyn Vanhecke V 05/23/2012 10:06 PM

## 2012-05-23 NOTE — Consult Note (Signed)
Maternal Fetal Medicine Consultation  Requesting Provider(s): Scheryl Darter, MD  Reason for consultation: BPP 6/10,  Severe gestational hypertension vs. preeclampsia  HPI: Jill Reeves is a 27 yo G2P0010 currently at 42 5/7 weeks admitted due to severe gestational hypertension vs. Preeclampsia and decreased fetal movement.  Jill Reeves was admitted yesterday due to elevated blood pressures and headache associated with visual changes.  Jill Reeves reports decreased fetal movement over the last 1-2 weeks associated with NR NST that have required frequent BPPs. She was briefly admitted earlier this month with similar complaints.  Her 24-hr urine protein from 5/8 was 221 mg/24 hrs.  The patient's admission NST yesterday was NR - BPP was 6/8.  Her admission labs were remarkable for mildly elevated LFTs.  She is currently on Labetalol 400 mg BID with continued severe range blood pressures.  Jill Reeves currently complaints of a 8/10 headache - gets some relief with tylenol, but it never completely goes away.  She denies RUQ pain.  She reports decreased fetal movement.  OB History: OB History   Grav Para Term Preterm Abortions TAB SAB Ect Mult Living   2    1  1        early SAB  PMH:  Past Medical History  Diagnosis Date  . Gallstones   . Medical history non-contributory   . Headache   . Pregnancy induced hypertension   . Asthma   No history of hypertension prior to pregnancy  PSH:  Past Surgical History  Procedure Laterality Date  . Eye surgery    . Breast reduction surgery    . Dilation and curettage of uterus     Meds:  Scheduled Meds: . docusate sodium  100 mg Oral Daily  . labetalol  400 mg Oral BID  . pantoprazole  40 mg Oral Daily  . prenatal multivitamin  1 tablet Oral Q1200   Continuous Infusions: . lactated ringers 125 mL/hr at 05/23/12 0403   PRN Meds:.acetaminophen, calcium carbonate, zolpidem  Allergies: No Known Allergies  FH: denies family history of birth defects or  hereditary disorders  Soc: denies tobacco, ETOH or illicit drug use  Review of Systems: no vaginal bleeding or cramping/contractions, no LOF, no nausea/vomiting. All other systems reviewed and are negative.   PE:   Filed Vitals:   05/23/12 1113  BP: 128/94  Pulse: 98  Temp:   Resp: 18  BPs: 174/109, 159/126, 160/116, 155/104, 149/99  GEN: well-appearing female ABD: gravid, NT  Ultrasound: Single IUP at 33 5/7 weeks Elevated UA Doppler studies for gestational age, but no evidence of absent or reversed diastolic flow BPP 6/10 (-2 for tone, -2 for NR NST on floor) Normal amniotic fluid volume  Labs: CBC    Component Value Date/Time   WBC 14.8* 05/22/2012 1158   RBC 4.34 05/22/2012 1158   HGB 12.4 05/22/2012 1158   HGB 12.8 02/01/2012   HGB 12.8 02/01/2012   HCT 36.8 05/22/2012 1158   HCT 37 02/01/2012   HCT 37 02/01/2012   PLT 195 05/22/2012 1158   PLT 297 02/01/2012   PLT 297 02/01/2012   MCV 84.8 05/22/2012 1158   MCH 28.6 05/22/2012 1158   MCHC 33.7 05/22/2012 1158   RDW 14.5 05/22/2012 1158   CMP     Component Value Date/Time   NA 135 05/22/2012 1158   K 4.3 05/22/2012 1158   CL 102 05/22/2012 1158   CO2 24 05/22/2012 1158   GLUCOSE 70 05/22/2012 1158   BUN 8  05/22/2012 1158   CREATININE 0.64 05/22/2012 1158   CREATININE 0.71 05/10/2012 1925   CALCIUM 9.6 05/22/2012 1158   PROT 6.8 05/22/2012 1158   ALBUMIN 2.3* 05/22/2012 1158   AST 65* 05/22/2012 1158   ALT 54* 05/22/2012 1158   ALKPHOS 202* 05/22/2012 1158   BILITOT 0.3 05/22/2012 1158   GFRNONAA >90 05/22/2012 1158   GFRAA >90 05/22/2012 1158   24 hr urine protein pending   A/P: 1) Single IUP at 33 5/7 weeks         2) Likely severe preeclampsia, BPP 6/10 - based on severe range blood pressures despite Labetalol, marginally elevated LFTs and severe headache and concerns about fetal testing, feel that delivery is indicated.  Recommend beginning Magnesium sulfate prophylaxis.  The patient expressed a desire for a vaginal  delivery - may be reasonable to perform a CST to determine whether or not the fetus will tolerate labor before proceeding with induction of labor.  Findings and recommendations were discussed with Dr. Debroah Loop.   Thank you for the opportunity to be a part of the care of Jill Reeves. Please contact our office if we can be of further assistance.   I spent approximately 30 minutes with this patient with over 50% of time spent in face-to-face counseling.  Alpha Gula, MD Maternal Fetal Medicine

## 2012-05-23 NOTE — Progress Notes (Signed)
Kayly Kriegel is a 27 y.o. G2P0010 at [redacted]w[redacted]d by ultrasound admitted for induction of labor due to severe Preeclampsia.  Subjective: Pt denies headache or RUQ pain. CST has been completed and is negative CST.  Objective: BP 155/97  Pulse 74  Temp(Src) 98.2 F (36.8 C) (Oral)  Resp 20  Ht 5' (1.524 m)  Wt 183 lb (83.008 kg)  BMI 35.74 kg/m2  SpO2 100%  LMP 09/30/2011  Reflexes 2+ without clonus    FHT:  FHR: 150 bpm, variability: minimal ,  accelerations:  Abscent,  decelerations:  Absent UC:   none, completed CST earlier SVE:   Dilation: Closed Effacement (%): Thick Station: -2 Exam by:: [employer]  Labs: Lab Results  Component Value Date   WBC 14.8* 05/22/2012   HGB 12.4 05/22/2012   HCT 36.8 05/22/2012   MCV 84.8 05/22/2012   PLT 195 05/22/2012   CMP     Component Value Date/Time   NA 135 05/22/2012 1158   K 4.3 05/22/2012 1158   CL 102 05/22/2012 1158   CO2 24 05/22/2012 1158   GLUCOSE 70 05/22/2012 1158   BUN 8 05/22/2012 1158   CREATININE 0.64 05/22/2012 1158   CREATININE 0.71 05/10/2012 1925   CALCIUM 9.6 05/22/2012 1158   PROT 6.8 05/22/2012 1158   ALBUMIN 2.3* 05/22/2012 1158   AST 65* 05/22/2012 1158   ALT 54* 05/22/2012 1158   ALKPHOS 202* 05/22/2012 1158   BILITOT 0.3 05/22/2012 1158   GFRNONAA >90 05/22/2012 1158   GFRAA >90 05/22/2012 1158      Assessment / Plan: IOL for severe PreEclampsia,  Unfavorable cervix will require ripenining  Labor: will start with cervical ripening Preeclampsia:  on magnesium sulfate Fetal Wellbeing:  Category I Pain Control:   I/D:   Anticipated MOD:  undetermined.  Jaiyden Laur V 05/23/2012, 6:38 PM

## 2012-05-23 NOTE — H&P (Signed)
Attestation of Attending Supervision of Advanced Practitioner: Evaluation and management procedures were performed by the PA/NP/CNM/OB Fellow under my supervision/collaboration. Chart reviewed and agree with management and plan.  Kynzley Dowson V 05/23/2012 10:05 PM

## 2012-05-23 NOTE — Progress Notes (Signed)
Patient ID: Jill Reeves, female   DOB: 09/07/1985, 27 y.o.   MRN: [id number] FACULTY PRACTICE ANTEPARTUM(COMPREHENSIVE) NOTE  Jill Reeves is a 27 y.o. G2P0010 at [redacted]w[redacted]d by LMP who is admitted for gestational hypertension, possible pre-eclampsia.   Fetal presentation is cephalic. Length of Stay:  1  Days  Subjective: Had emesis with blood, nosebleed just now Patient reports the fetal movement as decreased . Patient reports uterine contraction  activity as none. Patient reports  vaginal bleeding as none. Patient describes fluid per vagina as None.  Vitals:  Blood pressure 128/94, pulse 98, temperature 98.2 F (36.8 C), temperature source Oral, resp. rate 18, height 5' (1.524 m), weight 183 lb (83.008 kg), last menstrual period 09/30/2011, SpO2 100.00%. Physical Examination:  General appearance - alert, well appearing, and in no distress and just vomited with nosebleed   Abdomen - soft, nontender, nondistended Fundal Height:  size equals dates Cervical Exam: Not evaluated. Extremities: extremities normal, atraumatic, no cyanosis or edema and Homans sign is negative, no sign of DVT with DTRs 2+ bilaterally Membranes:intact  Fetal Monitoring:  Baseline: 140 bpm, Variability: Fair (1-6 bpm), Accelerations: non-reactive and Decelerations: Absent  Labs:  Results for orders placed during the hospital encounter of 05/22/12 (from the past 24 hour(s))  URINALYSIS, ROUTINE W REFLEX MICROSCOPIC   Collection Time    05/22/12 11:40 AM      Result Value Range   Color, Urine YELLOW  YELLOW   APPearance HAZY (*) CLEAR   Specific Gravity, Urine 1.015  1.005 - 1.030   pH 6.0  5.0 - 8.0   Glucose, UA NEGATIVE  NEGATIVE mg/dL   Hgb urine dipstick TRACE (*) NEGATIVE   Bilirubin Urine NEGATIVE  NEGATIVE   Ketones, ur NEGATIVE  NEGATIVE mg/dL   Protein, ur 30 (*) NEGATIVE mg/dL   Urobilinogen, UA 0.2  0.0 - 1.0 mg/dL   Nitrite NEGATIVE  NEGATIVE   Leukocytes, UA NEGATIVE  NEGATIVE  URINE  MICROSCOPIC-ADD ON   Collection Time    05/22/12 11:40 AM      Result Value Range   Squamous Epithelial / LPF MANY (*) RARE   WBC, UA 0-2  <3 WBC/hpf   Bacteria, UA FEW (*) RARE  CBC   Collection Time    05/22/12 11:58 AM      Result Value Range   WBC 14.8 (*) 4.0 - 10.5 K/uL   RBC 4.34  3.87 - 5.11 MIL/uL   Hemoglobin 12.4  12.0 - 15.0 g/dL   HCT 40.9  81.1 - 91.4 %   MCV 84.8  78.0 - 100.0 fL   MCH 28.6  26.0 - 34.0 pg   MCHC 33.7  30.0 - 36.0 g/dL   RDW 78.2  95.6 - 21.3 %   Platelets 195  150 - 400 K/uL  COMPREHENSIVE METABOLIC PANEL   Collection Time    05/22/12 11:58 AM      Result Value Range   Sodium 135  135 - 145 mEq/L   Potassium 4.3  3.5 - 5.1 mEq/L   Chloride 102  96 - 112 mEq/L   CO2 24  19 - 32 mEq/L   Glucose, Bld 70  70 - 99 mg/dL   BUN 8  6 - 23 mg/dL   Creatinine, Ser 0.86  0.50 - 1.10 mg/dL   Calcium 9.6  8.4 - 57.8 mg/dL   Total Protein 6.8  6.0 - 8.3 g/dL   Albumin 2.3 (*) 3.5 - 5.2 g/dL   AST 65 (*)  0 - 37 U/L   ALT 54 (*) 0 - 35 U/L   Alkaline Phosphatase 202 (*) 39 - 117 U/L   Total Bilirubin 0.3  0.3 - 1.2 mg/dL   GFR calc non Af Amer >90  >90 mL/min   GFR calc Af Amer >90  >90 mL/min  TYPE AND SCREEN   Collection Time    05/22/12  7:30 PM      Result Value Range   ABO/RH(D) O POS     Antibody Screen NEG     Sample Expiration 05/25/2012    ABO/RH   Collection Time    05/22/12  7:30 PM      Result Value Range   ABO/RH(D) O POS      Imaging Studies:      BPP 6/8 yesterday, nl AFI, EFW 48 %ile  Medications:  Scheduled . docusate sodium  100 mg Oral Daily  . labetalol  400 mg Oral BID  . pantoprazole  40 mg Oral Daily  . prenatal multivitamin  1 tablet Oral Q1200   I have reviewed the patient's current medications.  ASSESSMENT: Patient Active Problem List   Diagnosis Date Noted  . Hypertension in pregnancy, preeclampsia 05/10/2012    PLAN: Emesis and nosebleed Needs repeat BPP-[facility] Follow for s/sx severe  pre-eclampsia  ARNOLD,JAMES 05/23/2012,11:37 AM

## 2012-05-23 NOTE — Progress Notes (Signed)
Ur chart review completed.  

## 2012-05-23 NOTE — Progress Notes (Signed)
Dr Emelda Fear called for order clarification.  Ferguson to report to unit in 15 min to clarify orders.

## 2012-05-23 NOTE — Progress Notes (Signed)
Jill Reeves is a 27 y.o. G2P0010 at [redacted]w[redacted]d by LMP admitted for severe preeclampsia  Subjective:no contractions, just returned from [facility] after [place] and dopplers   Objective: BP 128/94  Pulse 98  Temp(Src) 98.2 F (36.8 C) (Oral)  Resp 18  Ht 5' (1.524 m)  Wt 183 lb (83.008 kg)  BMI 35.74 kg/m2  SpO2 100%  LMP 09/30/2011      FHT:  140, mod variability. No accel, no decel UC:   rare SVE:   Dilation: Closed Effacement (%): Thick Exam by:: Dr Debroah Loop  Labs: Lab Results  Component Value Date   WBC 14.8* 05/22/2012   HGB 12.4 05/22/2012   HCT 36.8 05/22/2012   MCV 84.8 05/22/2012   PLT 195 05/22/2012    Assessment / Plan: 33.5 weeks, severe preeclampsia   Preeclampsia:  severe Fetal Wellbeing:  Category II  I/D:  need rapid GBS Anticipated MOD:  per Dr. Fredda Hammed recommendation will do CST  Jill Reeves 05/23/2012, 2:10 PM

## 2012-05-24 ENCOUNTER — Encounter (HOSPITAL_COMMUNITY): Payer: Self-pay | Admitting: Anesthesiology

## 2012-05-24 ENCOUNTER — Other Ambulatory Visit: Payer: BC Managed Care – PPO

## 2012-05-24 ENCOUNTER — Encounter (HOSPITAL_COMMUNITY)
Admission: AD | Disposition: A | Discharge status: Home / Self Care | Payer: Self-pay | Source: Ambulatory Visit | Attending: Obstetrics and Gynecology

## 2012-05-24 ENCOUNTER — Inpatient Hospital Stay (HOSPITAL_COMMUNITY): Payer: Medicaid Other | Admitting: Anesthesiology

## 2012-05-24 DIAGNOSIS — O1414 Severe pre-eclampsia complicating childbirth: Secondary | ICD-10-CM

## 2012-05-24 LAB — CBC
MCHC: 33.1 g/dL (ref 30.0–36.0)
RDW: 14.8 % (ref 11.5–15.5)

## 2012-05-24 SURGERY — Surgical Case
Anesthesia: Spinal

## 2012-05-24 MED ORDER — MENTHOL 3 MG MT LOZG: 1.0000 | LOZENGE | OROMUCOSAL | Status: DC | PRN

## 2012-05-24 MED ORDER — MEPERIDINE HCL 25 MG/ML IJ SOLN
6.2500 mg | INTRAMUSCULAR | Status: DC | PRN
  Administered 2012-05-24 (×2): 6.25 mg via INTRAVENOUS

## 2012-05-24 MED ORDER — LANOLIN HYDROUS EX OINT: 1.0000 "application " | TOPICAL_OINTMENT | CUTANEOUS | Status: DC | PRN

## 2012-05-24 MED ORDER — FENTANYL CITRATE 0.05 MG/ML IJ SOLN
INTRAMUSCULAR | Status: DC | PRN
  Administered 2012-05-24: 25 ug via INTRATHECAL

## 2012-05-24 MED ORDER — ZOLPIDEM TARTRATE 5 MG PO TABS: 5.0000 mg | ORAL_TABLET | Freq: Every evening | ORAL | Status: DC | PRN

## 2012-05-24 MED ORDER — PROMETHAZINE HCL 25 MG/ML IJ SOLN: 6.2500 mg | INTRAMUSCULAR | Status: DC | PRN

## 2012-05-24 MED ORDER — NALBUPHINE HCL 10 MG/ML IJ SOLN
5.0000 mg | INTRAMUSCULAR | Status: DC | PRN
  Filled 2012-05-24: qty 1

## 2012-05-24 MED ORDER — IBUPROFEN 600 MG PO TABS
600.0000 mg | ORAL_TABLET | Freq: Four times a day (QID) | ORAL | Status: DC
  Administered 2012-05-24 – 2012-05-27 (×11): 600 mg via ORAL
  Filled 2012-05-24 (×11): qty 1

## 2012-05-24 MED ORDER — PHENYLEPHRINE 40 MCG/ML (10ML) SYRINGE FOR IV PUSH (FOR BLOOD PRESSURE SUPPORT)
PREFILLED_SYRINGE | INTRAVENOUS | Status: AC
  Filled 2012-05-24: qty 5

## 2012-05-24 MED ORDER — EPHEDRINE 5 MG/ML INJ
INTRAVENOUS | Status: AC
  Filled 2012-05-24: qty 10

## 2012-05-24 MED ORDER — MORPHINE SULFATE 0.5 MG/ML IJ SOLN
INTRAMUSCULAR | Status: AC
  Filled 2012-05-24: qty 10

## 2012-05-24 MED ORDER — ONDANSETRON HCL 4 MG PO TABS: 4.0000 mg | ORAL_TABLET | ORAL | Status: DC | PRN

## 2012-05-24 MED ORDER — FENTANYL CITRATE 0.05 MG/ML IJ SOLN
INTRAMUSCULAR | Status: AC
  Filled 2012-05-24: qty 2

## 2012-05-24 MED ORDER — FENTANYL CITRATE 0.05 MG/ML IJ SOLN: 25.0000 ug | INTRAMUSCULAR | Status: DC | PRN

## 2012-05-24 MED ORDER — ACETAMINOPHEN 10 MG/ML IV SOLN
1000.0000 mg | Freq: Four times a day (QID) | INTRAVENOUS | Status: AC | PRN
  Filled 2012-05-24: qty 100

## 2012-05-24 MED ORDER — SIMETHICONE 80 MG PO CHEW
80.0000 mg | CHEWABLE_TABLET | Freq: Three times a day (TID) | ORAL | Status: DC
  Administered 2012-05-24 – 2012-05-26 (×10): 80 mg via ORAL

## 2012-05-24 MED ORDER — DIBUCAINE 1 % RE OINT: 1.0000 "application " | TOPICAL_OINTMENT | RECTAL | Status: DC | PRN

## 2012-05-24 MED ORDER — NALOXONE HCL 1 MG/ML IJ SOLN
1.0000 ug/kg/h | INTRAVENOUS | Status: DC | PRN
  Filled 2012-05-24: qty 2

## 2012-05-24 MED ORDER — DIPHENHYDRAMINE HCL 50 MG/ML IJ SOLN: 25.0000 mg | INTRAMUSCULAR | Status: DC | PRN

## 2012-05-24 MED ORDER — DIPHENHYDRAMINE HCL 25 MG PO CAPS: 25.0000 mg | ORAL_CAPSULE | Freq: Four times a day (QID) | ORAL | Status: DC | PRN

## 2012-05-24 MED ORDER — ONDANSETRON HCL 4 MG/2ML IJ SOLN
INTRAMUSCULAR | Status: DC | PRN
  Administered 2012-05-24: 4 mg via INTRAVENOUS

## 2012-05-24 MED ORDER — OXYTOCIN 10 UNIT/ML IJ SOLN
INTRAMUSCULAR | Status: AC
  Filled 2012-05-24: qty 4

## 2012-05-24 MED ORDER — KETOROLAC TROMETHAMINE 30 MG/ML IJ SOLN
INTRAMUSCULAR | Status: AC
  Filled 2012-05-24: qty 1

## 2012-05-24 MED ORDER — KETOROLAC TROMETHAMINE 30 MG/ML IJ SOLN
30.0000 mg | Freq: Four times a day (QID) | INTRAMUSCULAR | Status: DC | PRN
  Administered 2012-05-24: 30 mg via INTRAVENOUS

## 2012-05-24 MED ORDER — PHENYLEPHRINE HCL 10 MG/ML IJ SOLN
INTRAMUSCULAR | Status: DC | PRN
  Administered 2012-05-24: 80 ug via INTRAVENOUS
  Administered 2012-05-24: 120 ug via INTRAVENOUS

## 2012-05-24 MED ORDER — MEPERIDINE HCL 25 MG/ML IJ SOLN: 6.2500 mg | INTRAMUSCULAR | Status: DC | PRN

## 2012-05-24 MED ORDER — OXYCODONE-ACETAMINOPHEN 5-325 MG PO TABS
1.0000 | ORAL_TABLET | ORAL | Status: DC | PRN
  Administered 2012-05-25 – 2012-05-27 (×6): 1 via ORAL
  Filled 2012-05-24: qty 1
  Filled 2012-05-24: qty 2
  Filled 2012-05-24 (×4): qty 1

## 2012-05-24 MED ORDER — ONDANSETRON HCL 4 MG/2ML IJ SOLN
4.0000 mg | INTRAMUSCULAR | Status: DC | PRN
  Administered 2012-05-24: 4 mg via INTRAVENOUS
  Filled 2012-05-24: qty 2

## 2012-05-24 MED ORDER — DIPHENHYDRAMINE HCL 25 MG PO CAPS: 25.0000 mg | ORAL_CAPSULE | ORAL | Status: DC | PRN

## 2012-05-24 MED ORDER — METOCLOPRAMIDE HCL 5 MG/ML IJ SOLN: 10.0000 mg | Freq: Three times a day (TID) | INTRAMUSCULAR | Status: DC | PRN

## 2012-05-24 MED ORDER — SCOPOLAMINE 1 MG/3DAYS TD PT72
MEDICATED_PATCH | TRANSDERMAL | Status: AC
  Filled 2012-05-24: qty 1

## 2012-05-24 MED ORDER — KETOROLAC TROMETHAMINE 30 MG/ML IJ SOLN: 30.0000 mg | Freq: Four times a day (QID) | INTRAMUSCULAR | Status: DC | PRN

## 2012-05-24 MED ORDER — SODIUM CHLORIDE 0.9 % IJ SOLN
3.0000 mL | INTRAMUSCULAR | Status: DC | PRN
  Administered 2012-05-25: 3 mL via INTRAVENOUS

## 2012-05-24 MED ORDER — SIMETHICONE 80 MG PO CHEW: 80.0000 mg | CHEWABLE_TABLET | ORAL | Status: DC | PRN

## 2012-05-24 MED ORDER — WITCH HAZEL-GLYCERIN EX PADS: 1.0000 "application " | MEDICATED_PAD | CUTANEOUS | Status: DC | PRN

## 2012-05-24 MED ORDER — KETOROLAC TROMETHAMINE 30 MG/ML IJ SOLN
30.0000 mg | Freq: Four times a day (QID) | INTRAMUSCULAR | Status: DC | PRN
  Administered 2012-05-24: 30 mg via INTRAVENOUS
  Filled 2012-05-24: qty 1

## 2012-05-24 MED ORDER — MIDAZOLAM HCL 2 MG/2ML IJ SOLN: 0.5000 mg | Freq: Once | INTRAMUSCULAR | Status: DC | PRN

## 2012-05-24 MED ORDER — SCOPOLAMINE 1 MG/3DAYS TD PT72
1.0000 | MEDICATED_PATCH | Freq: Once | TRANSDERMAL | Status: DC
  Administered 2012-05-24: 1.5 mg via TRANSDERMAL

## 2012-05-24 MED ORDER — BUPIVACAINE HCL (PF) 0.5 % IJ SOLN
INTRAMUSCULAR | Status: DC | PRN
  Administered 2012-05-24: 11 mg

## 2012-05-24 MED ORDER — MORPHINE SULFATE (PF) 0.5 MG/ML IJ SOLN
INTRAMUSCULAR | Status: DC | PRN
  Administered 2012-05-24: .15 ug via INTRATHECAL

## 2012-05-24 MED ORDER — CEFAZOLIN SODIUM-DEXTROSE 2-3 GM-% IV SOLR
INTRAVENOUS | Status: DC | PRN
  Administered 2012-05-24: 2 g via INTRAVENOUS

## 2012-05-24 MED ORDER — LABETALOL HCL 5 MG/ML IV SOLN: 20.0000 mg | Freq: Once | INTRAVENOUS | Status: DC

## 2012-05-24 MED ORDER — ONDANSETRON HCL 4 MG/2ML IJ SOLN
INTRAMUSCULAR | Status: AC
  Filled 2012-05-24: qty 2

## 2012-05-24 MED ORDER — MEPERIDINE HCL 25 MG/ML IJ SOLN
INTRAMUSCULAR | Status: AC
  Filled 2012-05-24: qty 1

## 2012-05-24 MED ORDER — OXYTOCIN 40 UNITS IN LACTATED RINGERS INFUSION - SIMPLE MED: 62.5000 mL/h | INTRAVENOUS | Status: AC

## 2012-05-24 MED ORDER — PRENATAL MULTIVITAMIN CH
1.0000 | ORAL_TABLET | Freq: Every day | ORAL | Status: DC
  Administered 2012-05-25 – 2012-05-27 (×3): 1 via ORAL
  Filled 2012-05-24 (×3): qty 1

## 2012-05-24 MED ORDER — TETANUS-DIPHTH-ACELL PERTUSSIS 5-2.5-18.5 LF-MCG/0.5 IM SUSP
0.5000 mL | Freq: Once | INTRAMUSCULAR | Status: AC
  Administered 2012-05-25: 0.5 mL via INTRAMUSCULAR
  Filled 2012-05-24: qty 0.5

## 2012-05-24 MED ORDER — ONDANSETRON HCL 4 MG/2ML IJ SOLN: 4.0000 mg | Freq: Three times a day (TID) | INTRAMUSCULAR | Status: DC | PRN

## 2012-05-24 MED ORDER — DIPHENHYDRAMINE HCL 50 MG/ML IJ SOLN: 12.5000 mg | INTRAMUSCULAR | Status: DC | PRN

## 2012-05-24 MED ORDER — NALOXONE HCL 0.4 MG/ML IJ SOLN: 0.4000 mg | INTRAMUSCULAR | Status: DC | PRN

## 2012-05-24 MED ORDER — CITRIC ACID-SODIUM CITRATE 334-500 MG/5ML PO SOLN
ORAL | Status: AC
  Administered 2012-05-24: 30 mL
  Filled 2012-05-24: qty 15

## 2012-05-24 MED ORDER — LABETALOL HCL 5 MG/ML IV SOLN: 20.0000 mg | INTRAVENOUS | Status: DC | PRN

## 2012-05-24 MED ORDER — LACTATED RINGERS IV SOLN
INTRAVENOUS | Status: DC
  Administered 2012-05-24 – 2012-05-25 (×2): via INTRAVENOUS

## 2012-05-24 MED ORDER — SENNOSIDES-DOCUSATE SODIUM 8.6-50 MG PO TABS
2.0000 | ORAL_TABLET | Freq: Every day | ORAL | Status: DC
  Administered 2012-05-24 – 2012-05-26 (×3): 2 via ORAL

## 2012-05-24 SURGICAL SUPPLY — 32 items
BENZOIN TINCTURE PRP APPL 2/3 (GAUZE/BANDAGES/DRESSINGS) IMPLANT
CLOTH BEACON ORANGE TIMEOUT ST (SAFETY) ×2 IMPLANT
DRAPE LG THREE QUARTER DISP (DRAPES) ×2 IMPLANT
DRSG OPSITE POSTOP 4X10 (GAUZE/BANDAGES/DRESSINGS) ×2 IMPLANT
DURAPREP 26ML APPLICATOR (WOUND CARE) ×2 IMPLANT
ELECT REM PT RETURN 9FT ADLT (ELECTROSURGICAL) ×2
ELECTRODE REM PT RTRN 9FT ADLT (ELECTROSURGICAL) ×1 IMPLANT
EXTRACTOR VACUUM KIWI (MISCELLANEOUS) IMPLANT
GLOVE BIO SURGEON ST LM GN SZ9 (GLOVE) ×2 IMPLANT
GLOVE BIOGEL PI IND STRL 9 (GLOVE) ×1 IMPLANT
GLOVE BIOGEL PI INDICATOR 9 (GLOVE) ×1
GOWN PREVENTION PLUS XLARGE (GOWN DISPOSABLE) ×2 IMPLANT
GOWN STRL REIN 3XL LVL4 (GOWN DISPOSABLE) ×2 IMPLANT
GOWN STRL REIN XL XLG (GOWN DISPOSABLE) ×4 IMPLANT
NEEDLE HYPO 25X5/8 SAFETYGLIDE (NEEDLE) IMPLANT
NS IRRIG 1000ML POUR BTL (IV SOLUTION) ×2 IMPLANT
PACK C SECTION WH (CUSTOM PROCEDURE TRAY) ×2 IMPLANT
PAD OB MATERNITY 4.3X12.25 (PERSONAL CARE ITEMS) ×2 IMPLANT
RETRACTOR WND ALEXIS 25 LRG (MISCELLANEOUS) IMPLANT
RTRCTR C-SECT PINK 25CM LRG (MISCELLANEOUS) IMPLANT
RTRCTR WOUND ALEXIS 25CM LRG (MISCELLANEOUS)
STRIP CLOSURE SKIN 1/2X4 (GAUZE/BANDAGES/DRESSINGS) IMPLANT
SUT CHROMIC 0 CTX 36 (SUTURE) ×4 IMPLANT
SUT VIC AB 0 CT1 27 (SUTURE) ×1
SUT VIC AB 0 CT1 27XBRD ANBCTR (SUTURE) ×1 IMPLANT
SUT VIC AB 2-0 CT1 27 (SUTURE) ×2
SUT VIC AB 2-0 CT1 TAPERPNT 27 (SUTURE) ×2 IMPLANT
SUT VIC AB 4-0 KS 27 (SUTURE) ×2 IMPLANT
SYR BULB IRRIGATION 50ML (SYRINGE) IMPLANT
TOWEL OR 17X24 6PK STRL BLUE (TOWEL DISPOSABLE) ×2 IMPLANT
TRAY FOLEY CATH 14FR (SET/KITS/TRAYS/PACK) ×2 IMPLANT
WATER STERILE IRR 1000ML POUR (IV SOLUTION) ×2 IMPLANT

## 2012-05-24 NOTE — Progress Notes (Signed)
Dr Emelda Fear explained the risks and benefits of c section due to fetal intolerance of labor.  Pt verbalized understanding and signed consent.  Orders given to give magnesium bolus before transport to OR.

## 2012-05-24 NOTE — Transfer of Care (Signed)
Immediate Anesthesia Transfer of Care Note  Patient: Jill Reeves  Procedure(s) Performed: Procedure(s): CESAREAN SECTION (N/A)  Patient Location: PACU  Anesthesia Type:Spinal  Level of Consciousness: awake and oriented  Airway & Oxygen Therapy: Patient Spontanous Breathing  Post-op Assessment: Report given to PACU RN and Post -op Vital signs reviewed and stable  Post vital signs: Reviewed and stable  Complications: No apparent anesthesia complications

## 2012-05-24 NOTE — Progress Notes (Signed)
Dr Emelda Fear updated on FHR, maternal BP and headache.  Emelda Fear to report to department now to review strip and go over POC.

## 2012-05-24 NOTE — Op Note (Signed)
See operative details included in the brief operative note 

## 2012-05-24 NOTE — Anesthesia Postprocedure Evaluation (Signed)
  Anesthesia Post-op Note  Patient: Jill Reeves  Procedure(s) Performed: Procedure(s): CESAREAN SECTION (N/A)  Patient Location: PACU and ICU  Anesthesia Type:Spinal  Level of Consciousness: awake, alert  and oriented  Airway and Oxygen Therapy: Patient Spontanous Breathing  Post-op Pain: mild  Post-op Assessment: Post-op Vital signs reviewed, Patient's Cardiovascular Status Stable, No headache, No backache, No residual numbness and No residual motor weakness  Post-op Vital Signs: Reviewed and stable  Complications: No apparent anesthesia complications

## 2012-05-24 NOTE — Anesthesia Preprocedure Evaluation (Signed)
Anesthesia Evaluation  Patient identified by MRN, date of birth, ID band Patient awake    Reviewed: Allergy & Precautions, H&P , NPO status , Patient's Chart, lab work & pertinent test results  Airway Mallampati: II      Dental no notable dental hx.    Pulmonary neg pulmonary ROS,  breath sounds clear to auscultation  Pulmonary exam normal       Cardiovascular Exercise Tolerance: Good hypertension, negative cardio ROS  Rhythm:regular Rate:Normal     Neuro/Psych  Headaches, negative neurological ROS  negative psych ROS   GI/Hepatic negative GI ROS, Neg liver ROS,   Endo/Other  negative endocrine ROSMorbid obesity  Renal/GU negative Renal ROS  negative genitourinary   Musculoskeletal   Abdominal Normal abdominal exam  (+)   Peds  Hematology negative hematology ROS (+)   Anesthesia Other Findings   Reproductive/Obstetrics (+) Pregnancy                           Anesthesia Physical Anesthesia Plan  ASA: III and emergent  Anesthesia Plan: Spinal   Post-op Pain Management:    Induction:   Airway Management Planned:   Additional Equipment:   Intra-op Plan:   Post-operative Plan:   Informed Consent: I have reviewed the patients History and Physical, chart, labs and discussed the procedure including the risks, benefits and alternatives for the proposed anesthesia with the patient or authorized representative who has indicated his/her understanding and acceptance.     Plan Discussed with: Anesthesiologist, CRNA and Surgeon  Anesthesia Plan Comments:         Anesthesia Quick Evaluation

## 2012-05-24 NOTE — Anesthesia Postprocedure Evaluation (Signed)
  Anesthesia Post Note  Patient: Jill Reeves  Procedure(s) Performed: Procedure(s) (LRB): CESAREAN SECTION (N/A)  Anesthesia type: Spinal  Patient location: PACU  Post pain: Pain level controlled  Post assessment: Post-op Vital signs reviewed  Last Vitals:  Filed Vitals:   05/24/12 0401  BP:   Pulse:   Temp: 36.6 C  Resp:     Post vital signs: Reviewed  Level of consciousness: awake  Complications: No apparent anesthesia complications

## 2012-05-24 NOTE — Progress Notes (Signed)
UR chart review completed.  

## 2012-05-24 NOTE — Progress Notes (Signed)
Jill Reeves is a 27 y.o. G2P0010 at [redacted]w[redacted]d by ultrasound admitted for induction of labor due to Hypertension.  Subjective: Patient monitoring strip reviewed.  Beat to beat absent at present, and review of strip shows occasional lates.  Cervix remains firm, closed, midposition.   Objective: BP 162/75  Pulse 72  Temp(Src) 98.3 F (36.8 C) (Oral)  Resp 18  Ht 5' (1.524 m)  Wt 183 lb (83.008 kg)  BMI 35.74 kg/m2  SpO2 100%  LMP 09/30/2011      FHT:  FHR: 150 bpm, variability: minimal ,  accelerations:  Abscent,  decelerations:  Absent UC:   irregular, every 5-8 minutes SVE:   Dilation: Closed Effacement (%): Thick Station: -3;-2 Exam by:: Dr Emelda Fear  Labs: Lab Results  Component Value Date   WBC 14.8* 05/22/2012   HGB 12.4 05/22/2012   HCT 36.8 05/22/2012   MCV 84.8 05/22/2012   PLT 195 05/22/2012    Assessment / Plan: Intrauterine pregnancy 33 weeks 6 days severe preeclampsia, failed induction due to fetal intolerance of laborRecommendations primary cesarean section recommended discussed with patient and accepted after counseling  Labor: Discontinued due to fetal intolerance of labor Preeclampsia:  on magnesium sulfate Fetal Wellbeing:  Category III Pain Control:  Labor support without medications I/D:  n/a Anticipated MOD:  Primary cesarean section scheduled we'll obtain fresh CBC to confirm platelets  Cande Mastropietro V 05/24/2012, 2:04 AM

## 2012-05-24 NOTE — Brief Op Note (Signed)
05/22/2012 - 05/24/2012  4:09 AM  PATIENT:  Jill Reeves  27 y.o. female  PRE-OPERATIVE DIAGNOSIS:  Fetal intolerance to labor, severe preeclampsia, pregnancy [redacted]w[redacted]d  POST-OPERATIVE DIAGNOSIS:  same   PROCEDURE:  Procedure(s): CESAREAN SECTION (N/A) primary low transverse  SURGEON:  Surgeon(s) and Role:    * Tilda Burrow, MD - Primary  PHYSICIAN ASSISTANT:   ASSISTANTS: none  ANESTHESIA:   spinal  EBL:  Total I/O In: 900 [I.V.:900] Out: 550 [Urine:300; Blood:250]  BLOOD ADMINISTERED:none DRAINS: Urinary Catheter (Foley)   LOCAL MEDICATIONS USED:  NONE SPECIMEN:  Source of Specimen:  Was sent to pathology, placenta  DISPOSITION OF SPECIMEN:  PATHOLOGY COUNTS:  YES  TOURNIQUET:  * No tourniquets in log * DICTATION: .Dragon Dictation Details of procedure: Patient was taken operating room prepped and draped with spinal anesthesia introduced. Abdomen was prepped timeout conducted and Ancef administered 2 g. Magnesium sulfate was infusing. Abdomen was opened through Pfannenstiel type incision, Alexis wound retractor positioned place, and transverse uterine incision made after bladder flap developed, and the fetal vertex delivered through the incision without difficulty. The infant was a healthy female infant, with Apgars assigned of pediatrics see their notes for details. Placenta was delivered after cord blood sample and cord blood gas obtained, with small thin placenta. The amniotic fluid was lightly meconium discolored. The amniotic membranes did not have discoloration. There was fresh meconium on the surface of the placenta from the delivery process. Allis clamp was able to be passed through the cervix to ensure uterine emptying. 2 layer  closure of the uterus with 0 chromic suture was performed, running locking first layer continuous running second layer. 2 interrupted sutures were used to reapproximate the bladder flap. Abdomen was irrigated with saline solution, and anterior  peritoneum closed with running 2-0 Vicryl,, 0 Vicryl used for closure of the fascia, subcuticular skin closure using running 4-0 Vicryl on a Keith needle, after 3 interrupted sutures were placed in the subcutaneous fat using 2-0 Vicryl, to reapproximate the subcutaneous fat and obliterate the potential space. Ureters were applied Band-Aid applied patient to recovery in stable condition she will go to ICU for 24 hours magnesium sulfate therapy.  PLAN OF CARE: hAs active admission orders PATIENT DISPOSITION:  PACU - hemodynamically stable.   Delay start of Pharmacological VTE agent (>24hrs) due to surgical blood loss or risk of bleeding: yes

## 2012-05-24 NOTE — Anesthesia Procedure Notes (Signed)
Spinal  Patient location during procedure: OR Start time: 05/24/2012 3:07 AM Staffing Anesthesiologist: Angus Seller., Harrell Gave. Performed by: anesthesiologist  Preanesthetic Checklist Completed: patient identified, site marked, surgical consent, pre-op evaluation, timeout performed, IV checked, risks and benefits discussed and monitors and equipment checked Spinal Block Patient position: sitting Prep: DuraPrep Patient monitoring: heart rate, cardiac monitor, continuous pulse ox and blood pressure Approach: midline Location: L3-4 Injection technique: single-shot Needle Needle type: Sprotte  Needle gauge: 24 G Needle length: 9 cm Assessment Sensory level: T4 Additional Notes Patient identified.  Risk benefits discussed including failed block, incomplete pain control, headache, nerve damage, paralysis, blood pressure changes, nausea, vomiting, reactions to medication both toxic or allergic, and postpartum back pain.  Patient expressed understanding and wished to proceed.  All questions were answered.  Sterile technique used throughout procedure.  CSF was clear.  No parasthesia or other complications.  Please see nursing notes for vital signs.

## 2012-05-24 NOTE — Progress Notes (Signed)
Shaw CNM updated on FHR, and maternal BP and headache.  Orders given to call Dr Emelda Fear with update.

## 2012-05-24 NOTE — OR Nursing (Signed)
14Fr 10ml Foley Catheter placed prior to Operating Room

## 2012-05-25 ENCOUNTER — Encounter (HOSPITAL_COMMUNITY): Payer: Self-pay | Admitting: Obstetrics and Gynecology

## 2012-05-25 LAB — CBC
HCT: 31.9 % — ABNORMAL LOW (ref 36.0–46.0)
MCHC: 32.9 g/dL (ref 30.0–36.0)
MCV: 86.4 fL (ref 78.0–100.0)
RDW: 15.3 % (ref 11.5–15.5)

## 2012-05-25 NOTE — Progress Notes (Signed)
Subjective: Postpartum Day 1: Cesarean Delivery Patient reports pain controlled, ambulating, tolerating PO, voiding well, normal lochia, no headache, vision changes or RUQ pain.  Attempting to pump breast milk but no success as yet; patient has had breast reduction surgery and unsure if able to breastfeed.  Objective: Vital signs in last 24 hours: Temp:  [97.3 F (36.3 C)-98.3 F (36.8 C)] 98.3 F (36.8 C) (05/23 0400) Pulse Rate:  [68-91] 81 (05/23 0700) Resp:  [16-20] 18 (05/23 0600) BP: (106-158)/(63-111) 116/74 mmHg (05/23 0700) SpO2:  [94 %-100 %] 97 % (05/23 0700) Weight:  [80.309 kg (177 lb 0.8 oz)-81.239 kg (179 lb 1.6 oz)] 80.309 kg (177 lb 0.8 oz) (05/23 0601)  Filed Vitals:   05/25/12 0500 05/25/12 0600 05/25/12 0601 05/25/12 0700  BP: 123/77 127/84  116/74  Pulse: 91 83 76 81  Temp:      TempSrc:      Resp: 16 18    Height:      Weight:   80.309 kg (177 lb 0.8 oz)   SpO2: 94% 98% 97% 97%     Intake/Output Summary (Last 24 hours) at 05/25/12 0746 Last data filed at 05/25/12 0700  Gross per 24 hour  Intake 4935.83 ml  Output   5875 ml  Net -939.17 ml     Physical Exam:  General: alert, no distress Lochia:  appropriate Uterine Fundus: firm Incision: no significant drainage DVT Evaluation: no calf tenderness, no redness or cords, negative Homan's sign, no evidence of DVT, mild edema   Recent Labs  05/24/12 0238 05/25/12 0540  HGB 11.2* 10.5*  HCT 33.8* 31.9*    Assessment/Plan: Status post Cesarean section. Doing well postoperatively.  Preeclampsia:  BP controlled, diuresing well, LFTs elevated slightly yesterday but asymptomatic today. Magnesium off this morning, transfer to floor if BP remains stable. Discontinue PO labetalol. Lactation consult.  Anticipate discharge home tomorrow. Baby remains in NICU but doing well per mom.  Napoleon Form 05/25/2012, 7:46 AM

## 2012-05-26 LAB — CULTURE, BETA STREP (GROUP B ONLY)

## 2012-05-26 MED ORDER — LABETALOL HCL 200 MG PO TABS
200.0000 mg | ORAL_TABLET | Freq: Two times a day (BID) | ORAL | Status: DC
  Administered 2012-05-26 – 2012-05-27 (×3): 200 mg via ORAL
  Filled 2012-05-26 (×3): qty 1

## 2012-05-26 NOTE — Progress Notes (Signed)
Subjective: Postpartum Day 2: Cesarean Delivery Patient reports incisional pain, tolerating PO, + flatus and no problems voiding.    Objective: Vital signs in last 24 hours: Temp:  [98 F (36.7 C)-99 F (37.2 C)] 98.9 F (37.2 C) (05/24 0601) Pulse Rate:  [77-87] 77 (05/24 0601) Resp:  [16-20] 16 (05/24 0601) BP: (125-146)/(72-95) 146/86 mmHg (05/24 0601) SpO2:  [98 %-100 %] 98 % (05/24 0601) Weight:  [80.287 kg (177 lb)] 80.287 kg (177 lb) (05/24 0601)  Filed Vitals:   05/25/12 1848 05/25/12 2137 05/26/12 0209 05/26/12 0601  BP: 141/95 125/72 132/90 146/86  Pulse: 81 87 80 77  Temp: 98.6 F (37 C) 98.3 F (36.8 C) 99 F (37.2 C) 98.9 F (37.2 C)  TempSrc: Oral Oral Oral Oral  Resp: 18 16 16 16   Height:      Weight:    80.287 kg (177 lb)  SpO2: 100% 100% 99% 98%    Physical Exam:  General: alert, cooperative and no distress Lochia: appropriate Uterine Fundus: firm Incision: healing well, no significant drainage, old dark drainage on dressing DVT Evaluation: No evidence of DVT seen on physical exam.   Recent Labs  05/24/12 0238 05/25/12 0540  HGB 11.2* 10.5*  HCT 33.8* 31.9*    Assessment/Plan: Status post Cesarean section. Doing well postoperatively.  Continue care Will add back Labetalol 200mg  bid Plan discharge tomorrow.  [facility] 05/26/2012, 7:46 AM

## 2012-05-27 MED ORDER — LABETALOL HCL 200 MG PO TABS: 200.0000 mg | ORAL_TABLET | Freq: Two times a day (BID) | ORAL | Status: AC

## 2012-05-27 MED ORDER — IBUPROFEN 600 MG PO TABS: 600.0000 mg | ORAL_TABLET | Freq: Four times a day (QID) | ORAL | Status: AC

## 2012-05-27 MED ORDER — SENNOSIDES-DOCUSATE SODIUM 8.6-50 MG PO TABS: 2.0000 | ORAL_TABLET | Freq: Every day | ORAL | Status: AC

## 2012-05-27 MED ORDER — OXYCODONE-ACETAMINOPHEN 5-325 MG PO TABS: 1.0000 | ORAL_TABLET | ORAL | Status: AC | PRN

## 2012-05-27 NOTE — Plan of Care (Signed)
Problem: Discharge Progression Outcomes Goal: Activity appropriate for discharge plan Outcome: Completed/Met Date Met:  05/27/12 Tolerates walking in halls and going down to NICU Goal: Pain controlled with appropriate interventions Outcome: Completed/Met Date Met:  05/27/12 Good pain control on po Motrin and Percocet Goal: Discharge plan in place and appropriate Outcome: Completed/Met Date Met:  05/27/12 VSS Pain controlled Understands self care  Understands F/U

## 2012-05-27 NOTE — Discharge Summary (Signed)
Obstetric Discharge Summary Jill Reeves is a 27 y.o. G2P0110 admitted at [redacted]w[redacted]d for worsening gestational HTN and headache. She began having severe range blood pressures and induction of labor was begun for superimposed severe preeclampsia. However, she has non-reassuring fetal heart tones and was taken for primary low transverse cesarean section. She was continued on magnesium infusion postpartum and diuresed well. However, she continued to have high blood pressures and was started on labetalol. She is attempting to breastfeed and pump but has had breast reduction surgery and is not yet producing any breast milk or colostrum.  She plans Mirena for contraception.   Reason for Admission: Gestational hypertension/preeclampsia Prenatal Procedures: NST Intrapartum Procedures: cesarean: low cervical, transverse and magnesium Postpartum Procedures: none Complications-Operative and Postpartum: none Hemoglobin  Date Value Range Status  05/25/2012 10.5* 12.0 - 15.0 g/dL Final  1/61/0960 45.4   Final  02/01/2012 12.8   Final     HCT  Date Value Range Status  05/25/2012 31.9* 36.0 - 46.0 % Final  02/01/2012 37   Final  02/01/2012 37   Final   Filed Vitals:   05/26/12 1715 05/26/12 2100 05/27/12 0200 05/27/12 0636  BP: 130/82 147/96 125/67 136/81  Pulse: 98 91 90 96  Temp: 98 F (36.7 C) 98.7 F (37.1 C) 98.7 F (37.1 C) 98.1 F (36.7 C)  TempSrc: Oral Oral Oral Oral  Resp: 18 18 15 16   Height:      Weight:    79.833 kg (176 lb)  SpO2: 100% 98% 98% 100%    Physical Exam:  General: alert, cooperative and no distress Lochia: appropriate Uterine Fundus: firm Incision: healing well, no significant drainage, no dehiscence, no significant erythema DVT Evaluation: No evidence of DVT seen on physical exam. Negative Homan's sign. No cords or calf tenderness. Calf/Ankle edema is present.  Discharge Diagnoses: Preelampsia and preterm delivery, s/p cesarean section  Discharge  Information: Date: 05/27/2012 Activity: pelvic rest Diet: routine Medications: PNV, Ibuprofen, Colace and Percocet, labetalol 200 mg BID Condition: stable Instructions: refer to practice specific booklet Discharge to: home Follow-up Information   Follow up with [facility] On 05/31/2012. (For blood pressure check. You should receive a call for appointment time and date. If not, call to schedule.)    Contact information:   [address] [place] [address] [phone]      Follow up with [facility] In 6 weeks. (For postpartum visit)    Contact information:   [address] [place] [address] [phone]      Newborn Data: Live born female  Birth Weight: 3 lb 13.2 oz (1735 g) APGAR: 8, 9  Remains in NICU.  Napoleon Form 05/27/2012, 8:20 AM

## 2012-05-27 NOTE — Clinical Social Work Note (Signed)
Clinical Social Work Department PSYCHOSOCIAL ASSESSMENT - MATERNAL/CHILD 05/27/2012  Patient:  MATIKA, BARTELL  Account Number:  [id number]  Admit Date:  05/22/2012  Marjo Bicker Name:   Mosetta Anis    Clinical Social Worker:  Truman Hayward, LCSW   Date/Time:  05/27/2012 11:30 AM  Date Referred:  05/27/2012   Referral source  Physician     Referred reason  NICU   Other referral source:    I:  FAMILY / HOME ENVIRONMENT Child's legal guardian:  PARENT  Guardian - Name Guardian - Age Guardian - Address  Akua Blethen [address] [place], [place] [address]  Sharrell Ku  [address] [place], [place] [address]   Other household support members/support persons Name Relationship DOB  none     Other support:   MOB and FOB report good family support    II  PSYCHOSOCIAL DATA Information Source:  Patient Interview  Surveyor, quantity and Community Resources Employment:   MOB: [employer]  FOB: [employer] Cytogeneticist resources:  Medicaid If Medicaid - County:  [place] Other  [employer]  Chemical engineer / Grade:   Maternity Care Coordinator / Child Services Coordination / Early Interventions:  Cultural issues impacting care:    III  STRENGTHS Strengths  Adequate Resources  Home prepared for Child (including basic supplies)  Understanding of illness  Supportive family/friends  Compliance with medical plan   Strength comment:    IV  RISK FACTORS AND CURRENT PROBLEMS Current Problem:  None   Risk Factor & Current Problem Patient Issue Family Issue Risk Factor / Current Problem Comment   N N     V  SOCIAL WORK ASSESSMENT CSW spoke with MOB and FOB at bedside.  CSW introduced department and discussed offering support to NICU families. CSW discussed infant admission to NICU and illness.  MOB and FOB expressed good communication and knowledge of treatment. CSW discussed emotional stability.  MOB and FOB reports appropriate emotion around NICU  admission.  No hx of emotional concerns for MOB in chart, however MOB has hx of SAB.  MOB reports this hx is not effecting her at this time with NICU admission.  CSW discussed symptoms of PPD and MOB expressed she had information on this.  CSW discussed supplies and family support.  MOB and FOB expressed good support in the area.  MOB reports no concerns with supplies at this time, however, CSW instructed MOB and FOB to let CSW know if any assistance was needed.  CSW discussed insurance and financial support. MOB confirmed private insurance and  medicaid, as well as reported additional support of WIC and Foodstamps. No hx of SA issues in chart. CSW will continue to offer support while infant in NICU.      VI SOCIAL WORK PLAN Social Work Plan  Psychosocial Support/Ongoing Assessment of Needs   Type of pt/family education:   PPD symptoms   If child protective services report - county:   If child protective services report - date:   Information/referral to community resources comment:   Other social work plan:

## 2012-05-27 NOTE — Progress Notes (Signed)
Discharge instructions provided to patient at bedside.  Medications, activity, follow up appointments, when to call the doctor and community resources discussed.  No questions at this time.  Patient left unit in wheelchair in stable condition with all personal belongings and prescriptions accompanied by staff.  Osvaldo Angst, RN----------------

## 2012-05-27 NOTE — Discharge Summary (Signed)
Attestation of Attending Supervision of Advanced Practitioner (CNM/NP): Evaluation and management procedures were performed by the Advanced Practitioner under my supervision and collaboration.  I have reviewed the Advanced Practitioner's note and chart, and I agree with the management and plan.  Mahek Schlesinger 05/27/2012 8:43 AM

## 2012-05-27 NOTE — Lactation Note (Signed)
This note was copied from the chart of Jill Reeves. Lactation Consultation Note  Patient Name: Jill Reeves ZOXWR'U Date: 05/27/2012 Reason for consult: Follow-up assessment   Maternal Data Formula Feeding for Exclusion: Yes Reason for exclusion: Previous breast surgery (mastectomy, reduction, or augmentation where mother is unable to produce breast milk) Infant to breast within first hour of birth: No Breastfeeding delayed due to:: Infant status  Feeding   LATCH Score/Interventions                      Lactation Tools Discussed/Used     Consult Status Consult Status: Complete  Mom requesting loaner pump- for DC today. [employer] loaner completed. Mom reports that pumping is going well but she is not obtaining any Colostrum yet. Reassurance given. Mom reports that she pumped 6 times yesterday. Encouraged to try to get 8 times/ day to promote good milk supply. Mom reports that baby is now taking bottle. Encouraged to try to put baby to breast if able. No questions at present. To call prn  Pamelia Hoit 05/27/2012, 9:39 AM

## 2012-05-29 ENCOUNTER — Other Ambulatory Visit: Payer: BC Managed Care – PPO

## 2012-05-31 ENCOUNTER — Ambulatory Visit: Payer: Medicaid Other | Admitting: *Deleted

## 2012-05-31 VITALS — BP 147/99 | HR 120 | Temp 100.1°F | Ht 60.0 in | Wt 168.7 lb

## 2012-05-31 DIAGNOSIS — Z0130 Encounter for examination of blood pressure without abnormal findings: Secondary | ICD-10-CM

## 2012-05-31 NOTE — Progress Notes (Signed)
Here for bp check - had IOL for worsening gestational hypertension on 05/22/12 with C/s for [facility] 05/24/12 and discharged 05/27/12.  BP's reported to Dr . Burnice LoganKatrinka Blazing along with Temperature of 100.1 today and patient reports has taken labetolol in am, but forgets to take it some evenings.Aslo reported pateint c/o headache for first time today since delivery =4.  Also reported patient still has honeycomb dressing on wound.  Instructed to tell patient to keep postpartum appointment already scheduled and continue taking labetolol BID . Also for nurse to remove dressing today. .  Encouraged patient to set alarm on her smart phone for evening dose which she did. Removed dressing- Incision clean, dry, intact with few steristips.No edema or discharge or redness noted. Instructed patient to shower daily and clean incision and remove steristrips over the next few days. Instructed patient to use mirror to check incision daily and if any redness, discharge, wound opening, fever to come to MAU. Also instructed her is she gets a headache unrelieved by tylenol or her pain med , visual changes to come to hospital MAU. Patient voices understanding.

## 2012-06-05 ENCOUNTER — Ambulatory Visit: Payer: Self-pay

## 2012-06-05 NOTE — Lactation Note (Signed)
This note was copied from the chart of Jill Hatsuko Bizzarro. Lactation Consultation Note Mom states that she is not getting very much milk with pump, but that she does want to provide pumped breast milk for baby. Mom is getting about 18 mL per day, which she is bringing to the hospital to be fed to baby. Mom states she is pumping every 3 hours for 15 minutes and hand expressing before and after pumping. Mom states she has a h./o breast reduction 2012.  Referred mom to [email or web address]. Enc mom to continue pumping and hand expression. Questions answered.   Patient Name: Jill Reeves ZOXWR'U Date: 06/05/2012 Reason for consult: Follow-up assessment;Breast surgery;NICU baby   Maternal Data    Feeding Feeding Type: Formula Feeding method: Bottle Nipple Type: Slow - flow Length of feed: 60 min  LATCH Score/Interventions                      Lactation Tools Discussed/Used     Consult Status Consult Status: PRN    Lenard Forth 06/05/2012, 3:11 PM

## 2012-06-25 ENCOUNTER — Encounter: Payer: Self-pay | Admitting: Obstetrics & Gynecology

## 2012-06-25 ENCOUNTER — Ambulatory Visit (INDEPENDENT_AMBULATORY_CARE_PROVIDER_SITE_OTHER): Payer: Medicaid Other | Admitting: Obstetrics & Gynecology

## 2012-06-25 ENCOUNTER — Ambulatory Visit: Payer: BC Managed Care – PPO | Admitting: Family Medicine

## 2012-06-25 DIAGNOSIS — Z30430 Encounter for insertion of intrauterine contraceptive device: Secondary | ICD-10-CM

## 2012-06-25 DIAGNOSIS — Z3049 Encounter for surveillance of other contraceptives: Secondary | ICD-10-CM

## 2012-06-25 LAB — POCT PREGNANCY, URINE: Preg Test, Ur: NEGATIVE

## 2012-06-25 MED ORDER — LEVONORGESTREL 20 MCG/24HR IU IUD
INTRAUTERINE_SYSTEM | Freq: Once | INTRAUTERINE | Status: AC
  Administered 2012-06-25: 17:00:00 via INTRAUTERINE

## 2012-06-25 MED ORDER — IBUPROFEN 800 MG PO TABS: 800.0000 mg | ORAL_TABLET | Freq: Three times a day (TID) | ORAL | Status: AC | PRN

## 2012-06-25 NOTE — Progress Notes (Signed)
  Subjective:    Patient ID: Jill Reeves, female    DOB: 1985/06/29, 27 y.o.   MRN: [id number]  HPI  She is here today for a 4 week PP/Post C/S visit. She wants Mirena for birth control. She denies pp depression, reports normal bowel and bladder function. Denies problems with incision. She breast and bottle feeding. Denies sex yet. She had Mirena in the past but had it removed in order to have a pregnancy.  Review of Systems Her pap was normal at [facility] near the beginning of her pregnancy.    Objective:   Physical Exam UPT negative, consent signed, Time out procedure done. Cervix prepped with betadine and grasped with a single tooth tenaculum. Mirena was easily placed and the strings were cut to 3-4 cm. Uterus sounded to 9 cm. She tolerated the procedure well.        Assessment & Plan:  PP exam- stable HTN- she will follow up at [facility], currently stable with labetolol Contraception- Mirena. RTC 1 month for string check

## 2012-06-25 NOTE — Addendum Note (Signed)
Addended by: Faythe Casa on: 06/25/2012 04:41 PM   Modules accepted: Orders

## 2012-07-09 ENCOUNTER — Encounter: Payer: Self-pay | Admitting: *Deleted

## 2012-07-19 NOTE — Progress Notes (Signed)
I was contacted by Sherlean Foot, Human Resources Generalist at [employer] regarding a note received from their employee Topacio Cella stating that she was readmitted to [facility] on 07/03/12 due to an infection and tearing of her C/S stitches.  The note also stated that she was to be on bedrest until 08/02/12. The letter was typed in my name.  The pt's chart was reviewed, her last appointment was a PP follow-up in the clinic on 06/25/12 with no documentation of complications noted.  There was no admission on 07/03/12.  I did not write, print, or give this letter to Kellie Simmering.  Ms. Mickle Mallory was informed of this situation and a verification of documentation form was completed and faxed to her per her request.

## 2012-10-01 ENCOUNTER — Encounter: Payer: Self-pay | Admitting: Obstetrics & Gynecology

## 2012-10-01 ENCOUNTER — Telehealth: Payer: Self-pay | Admitting: *Deleted

## 2012-10-01 NOTE — Telephone Encounter (Signed)
Patient left a message stating that she needs a note stating that she was cleared to return to work on 07/20/12. She was seen for her postpartum on 06/25/12 but didn't get a note.

## 2012-10-04 NOTE — Telephone Encounter (Signed)
Called patient, no answer- left message stating we are trying to return your phone call from the other day, please call us back at the clinics

## 2012-10-08 ENCOUNTER — Ambulatory Visit: Payer: Medicaid Other | Admitting: Obstetrics & Gynecology

## 2012-10-09 NOTE — Telephone Encounter (Signed)
Patient was given note. 

## 2012-11-08 ENCOUNTER — Other Ambulatory Visit: Payer: Self-pay

## 2013-01-09 ENCOUNTER — Emergency Department (HOSPITAL_BASED_OUTPATIENT_CLINIC_OR_DEPARTMENT_OTHER): Payer: Medicaid Other

## 2013-01-09 ENCOUNTER — Encounter (HOSPITAL_BASED_OUTPATIENT_CLINIC_OR_DEPARTMENT_OTHER): Payer: Self-pay | Admitting: Emergency Medicine

## 2013-01-09 ENCOUNTER — Emergency Department (HOSPITAL_BASED_OUTPATIENT_CLINIC_OR_DEPARTMENT_OTHER)
Admission: EM | Admit: 2013-01-09 | Discharge: 2013-01-09 | Disposition: A | Discharge status: Home / Self Care | Payer: Medicaid Other | Attending: Emergency Medicine | Admitting: Emergency Medicine

## 2013-01-09 DIAGNOSIS — Z79899 Other long term (current) drug therapy: Secondary | ICD-10-CM | POA: Insufficient documentation

## 2013-01-09 DIAGNOSIS — B349 Viral infection, unspecified: Secondary | ICD-10-CM

## 2013-01-09 DIAGNOSIS — B9789 Other viral agents as the cause of diseases classified elsewhere: Secondary | ICD-10-CM | POA: Insufficient documentation

## 2013-01-09 DIAGNOSIS — Z8719 Personal history of other diseases of the digestive system: Secondary | ICD-10-CM | POA: Insufficient documentation

## 2013-01-09 DIAGNOSIS — J069 Acute upper respiratory infection, unspecified: Secondary | ICD-10-CM | POA: Insufficient documentation

## 2013-01-09 MED ORDER — PROMETHAZINE HCL 25 MG PO TABS: 25.0000 mg | ORAL_TABLET | Freq: Four times a day (QID) | ORAL | Status: AC | PRN

## 2013-01-09 NOTE — Discharge Instructions (Signed)
Cough, Adult  A cough is a reflex that helps clear your throat and airways. It can help heal the body or may be a reaction to an irritated airway. A cough may only last 2 or 3 weeks (acute) or may last more than 8 weeks (chronic).  CAUSES Acute cough:  Viral or bacterial infections. Chronic cough:  Infections.  Allergies.  Asthma.  Post-nasal drip.  Smoking.  Heartburn or acid reflux.  Some medicines.  Chronic lung problems (COPD).  Cancer. SYMPTOMS   Cough.  Fever.  Chest pain.  Increased breathing rate.  High-pitched whistling sound when breathing (wheezing).  Colored mucus that you cough up (sputum). TREATMENT   A bacterial cough may be treated with antibiotic medicine.  A viral cough must run its course and will not respond to antibiotics.  Your caregiver may recommend other treatments if you have a chronic cough. HOME CARE INSTRUCTIONS   Only take over-the-counter or prescription medicines for pain, discomfort, or fever as directed by your caregiver. Use cough suppressants only as directed by your caregiver.  Use a cold steam vaporizer or humidifier in your bedroom or home to help loosen secretions.  Sleep in a semi-upright position if your cough is worse at night.  Rest as needed.  Stop smoking if you smoke. SEEK IMMEDIATE MEDICAL CARE IF:   You have pus in your sputum.  Your cough starts to worsen.  You cannot control your cough with suppressants and are losing sleep.  You begin coughing up blood.  You have difficulty breathing.  You develop pain which is getting worse or is uncontrolled with medicine.  You have a fever. MAKE SURE YOU:   Understand these instructions.  Will watch your condition.  Will get help right away if you are not doing well or get worse. Document Released: 06/18/2010 Document Revised: 03/14/2011 Document Reviewed: 06/18/2010 [facility] Patient Information 2014 [place], [place]. Nausea and Vomiting Nausea is a  sick feeling that often comes before throwing up (vomiting). Vomiting is a reflex where stomach contents come out of your mouth. Vomiting can cause severe loss of body fluids (dehydration). Children and elderly adults can become dehydrated quickly, especially if they also have diarrhea. Nausea and vomiting are symptoms of a condition or disease. It is important to find the cause of your symptoms. CAUSES   Direct irritation of the stomach lining. This irritation can result from increased acid production (gastroesophageal reflux disease), infection, food poisoning, taking certain medicines (such as nonsteroidal anti-inflammatory drugs), alcohol use, or tobacco use.  Signals from the brain.These signals could be caused by a headache, heat exposure, an inner ear disturbance, increased pressure in the brain from injury, infection, a tumor, or a concussion, pain, emotional stimulus, or metabolic problems.  An obstruction in the gastrointestinal tract (bowel obstruction).  Illnesses such as diabetes, hepatitis, gallbladder problems, appendicitis, kidney problems, cancer, sepsis, atypical symptoms of a heart attack, or eating disorders.  Medical treatments such as chemotherapy and radiation.  Receiving medicine that makes you sleep (general anesthetic) during surgery. DIAGNOSIS Your caregiver may ask for tests to be done if the problems do not improve after a few days. Tests may also be done if symptoms are severe or if the reason for the nausea and vomiting is not clear. Tests may include:  Urine tests.  Blood tests.  Stool tests.  Cultures (to look for evidence of infection).  X-rays or other imaging studies. Test results can help your caregiver make decisions about treatment or the need for  additional tests. TREATMENT You need to stay well hydrated. Drink frequently but in small amounts.You may wish to drink water, sports drinks, clear broth, or eat frozen ice pops or gelatin dessert to help  stay hydrated.When you eat, eating slowly may help prevent nausea.There are also some antinausea medicines that may help prevent nausea. HOME CARE INSTRUCTIONS   Take all medicine as directed by your caregiver.  If you do not have an appetite, do not force yourself to eat. However, you must continue to drink fluids.  If you have an appetite, eat a normal diet unless your caregiver tells you differently.  Eat a variety of complex carbohydrates (rice, wheat, potatoes, bread), lean meats, yogurt, fruits, and vegetables.  Avoid high-fat foods because they are more difficult to digest.  Drink enough water and fluids to keep your urine clear or pale yellow.  If you are dehydrated, ask your caregiver for specific rehydration instructions. Signs of dehydration may include:  Severe thirst.  Dry lips and mouth.  Dizziness.  Dark urine.  Decreasing urine frequency and amount.  Confusion.  Rapid breathing or pulse. SEEK IMMEDIATE MEDICAL CARE IF:   You have blood or brown flecks (like coffee grounds) in your vomit.  You have black or bloody stools.  You have a severe headache or stiff neck.  You are confused.  You have severe abdominal pain.  You have chest pain or trouble breathing.  You do not urinate at least once every 8 hours.  You develop cold or clammy skin.  You continue to vomit for longer than 24 to 48 hours.  You have a fever. MAKE SURE YOU:   Understand these instructions.  Will watch your condition.  Will get help right away if you are not doing well or get worse. Document Released: 12/20/2004 Document Revised: 03/14/2011 Document Reviewed: 05/19/2010 [facility]Exit[facility] Patient Information 2014 [place]ExitCare, [place]LL[place].

## 2013-01-09 NOTE — ED Provider Notes (Signed)
Medical screening examination/treatment/procedure(s) were performed by non-physician practitioner and as supervising physician I was immediately available for consultation/collaboration.  EKG Interpretation   None         Jashun Puertas, MD 01/09/13 2322 

## 2013-01-09 NOTE — ED Notes (Signed)
C/o runny nose, prod cough x 6 days-NAD

## 2013-01-09 NOTE — ED Provider Notes (Signed)
CSN: [id number]     Arrival date & time 01/09/13  1321 History   First MD Initiated Contact with Patient 01/09/13 1533     Chief Complaint  Patient presents with  . URI   (Consider location/radiation/quality/duration/timing/severity/associated sxs/prior Treatment) Patient is a 28 y.o. female presenting with URI. The history is provided by the patient. No language interpreter was used.  URI Presenting symptoms: congestion and cough   Severity:  Moderate Duration:  6 days Timing:  Constant Progression:  Worsening Chronicity:  New Relieved by:  Nothing Worsened by:  Nothing tried Ineffective treatments:  None tried Risk factors: no diabetes mellitus   Pt reports cough for 6 days, vomitting today.   Past Medical History  Diagnosis Date  . Gallstones   . Medical history non-contributory   . Headache(784.0)   . Pregnancy induced hypertension    Past Surgical History  Procedure Laterality Date  . Eye surgery    . Breast reduction surgery    . Dilation and curettage of uterus    . Cesarean section N/A 05/24/2012    Procedure: CESAREAN SECTION;  Surgeon: Tilda Burrow, MD;  Location: [facility];  Service: Obstetrics;  Laterality: N/A;   Family History  Problem Relation Age of Onset  . Cancer Mother   . Diabetes Mother   . Hypertension Mother   . Diabetes Other   . Anemia Sister   . Asthma Sister   . Asthma Brother   . Cancer Maternal Grandmother     bladder cancer   History  Substance Use Topics  . Smoking status: Never Smoker   . Smokeless tobacco: Never Used  . Alcohol Use: No   OB History   Grav Para Term Preterm Abortions TAB SAB Ect Mult Living   2 1  1 1  1         Review of Systems  HENT: Positive for congestion.   Respiratory: Positive for cough.   All other systems reviewed and are negative.    Allergies  Review of patient's allergies indicates no known allergies.  Home Medications   Current Outpatient Rx  Name  Route  Sig  Dispense  Refill  .  ibuprofen (ADVIL,MOTRIN) 600 MG tablet   Oral   Take 1 tablet (600 mg total) by mouth every 6 (six) hours.   30 tablet   1   . ibuprofen (ADVIL,MOTRIN) 800 MG tablet   Oral   Take 1 tablet (800 mg total) by mouth every 8 (eight) hours as needed for pain.   60 tablet   2   . labetalol (NORMODYNE) 200 MG tablet   Oral   Take 1 tablet (200 mg total) by mouth 2 (two) times daily.   60 tablet   1   . oxyCODONE-acetaminophen (PERCOCET/ROXICET) 5-325 MG per tablet   Oral   Take 1-2 tablets by mouth every 4 (four) hours as needed.   20 tablet   0   . Prenatal Vit-Fe Fumarate-FA (PRENATAL MULTIVITAMIN) TABS   Oral   Take 1 tablet by mouth daily at 12 noon.         . senna-docusate (SENOKOT-S) 8.6-50 MG per tablet   Oral   Take 2 tablets by mouth at bedtime.   60 tablet   0    BP 140/91  Pulse 95  Temp(Src) 98.1 F (36.7 C) (Oral)  Resp 16  Ht 5' (1.524 m)  Wt 175 lb (79.379 kg)  BMI 34.18 kg/m2  SpO2 100% Physical Exam  Nursing note and vitals reviewed. Constitutional: She is oriented to person, place, and time. She appears well-developed and well-nourished.  HENT:  Head: Normocephalic and atraumatic.  Right Ear: External ear normal.  Nose: Nose normal.  Mouth/Throat: Oropharynx is clear and moist.  Eyes: EOM are normal. Pupils are equal, round, and reactive to light.  Neck: Normal range of motion.  Cardiovascular: Normal rate.   Pulmonary/Chest: Effort normal.  Abdominal: Soft. She exhibits no distension.  Musculoskeletal: Normal range of motion.  Neurological: She is alert and oriented to person, place, and time.  Skin: Skin is warm.  Psychiatric: She has a normal mood and affect.    ED Course  Procedures (including critical care time) Labs Review Labs Reviewed - No data to display Imaging Review Dg Chest 2 View  01/09/2013   CLINICAL DATA:  Cough, congestion, nausea, vomiting  EXAM: CHEST  2 VIEW  COMPARISON:  None.  FINDINGS: The heart size and  mediastinal contours are within normal limits. Both lungs are clear. The visualized skeletal structures are unremarkable.  IMPRESSION: No active cardiopulmonary disease.   Electronically Signed   By: Ruel Favorsrevor  Shick M.D.   On: 01/09/2013 16:00    EKG Interpretation   None       MDM   1. Viral illness    No pneumonia.   Pt given rx for zofran.   I advised follow up with primary Md for recheck   Elson AreasLeslie K Selma Rodelo, [place] JerseyPA-C 01/09/13 [id number]

## 2013-11-04 ENCOUNTER — Encounter (HOSPITAL_BASED_OUTPATIENT_CLINIC_OR_DEPARTMENT_OTHER): Payer: Self-pay | Admitting: Emergency Medicine

## 2015-03-25 IMAGING — US US FETAL BPP W/O NONSTRESS
1 series · 7 of 7 positions shown · non-contrast
Comparison: none

[Series 1: us fetal bpp w/o nonstress · non-contrast · 7 acquisitions, 7 frames shown]
[im 1/7]
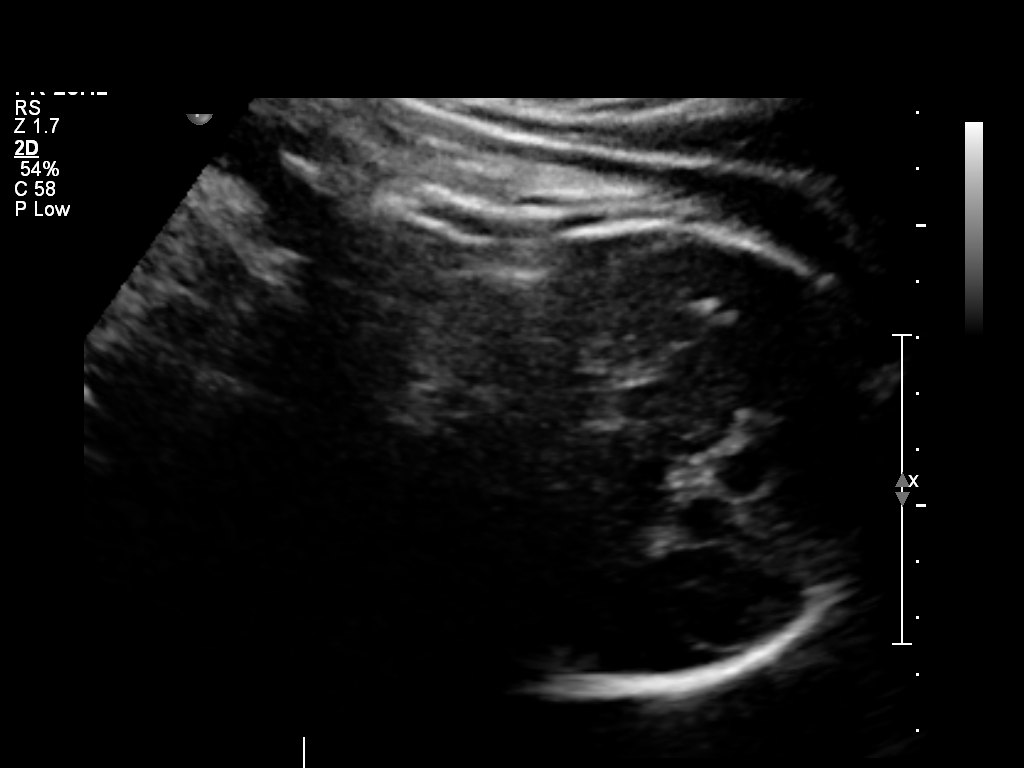
[im 2/7]
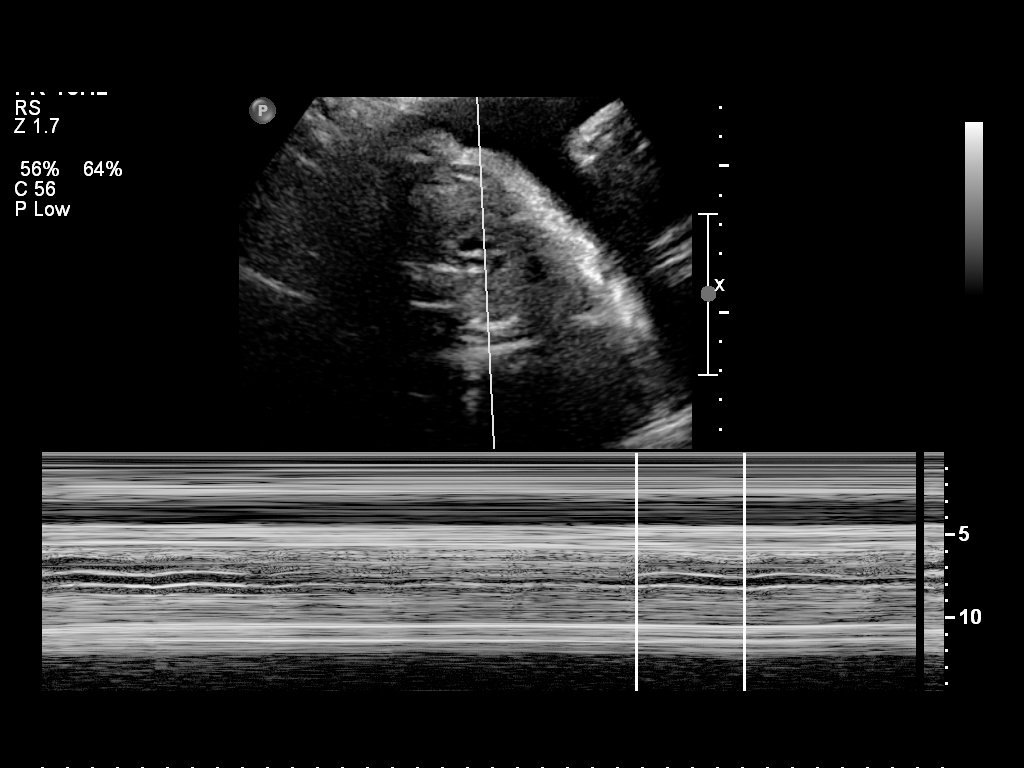
[im 3/7]
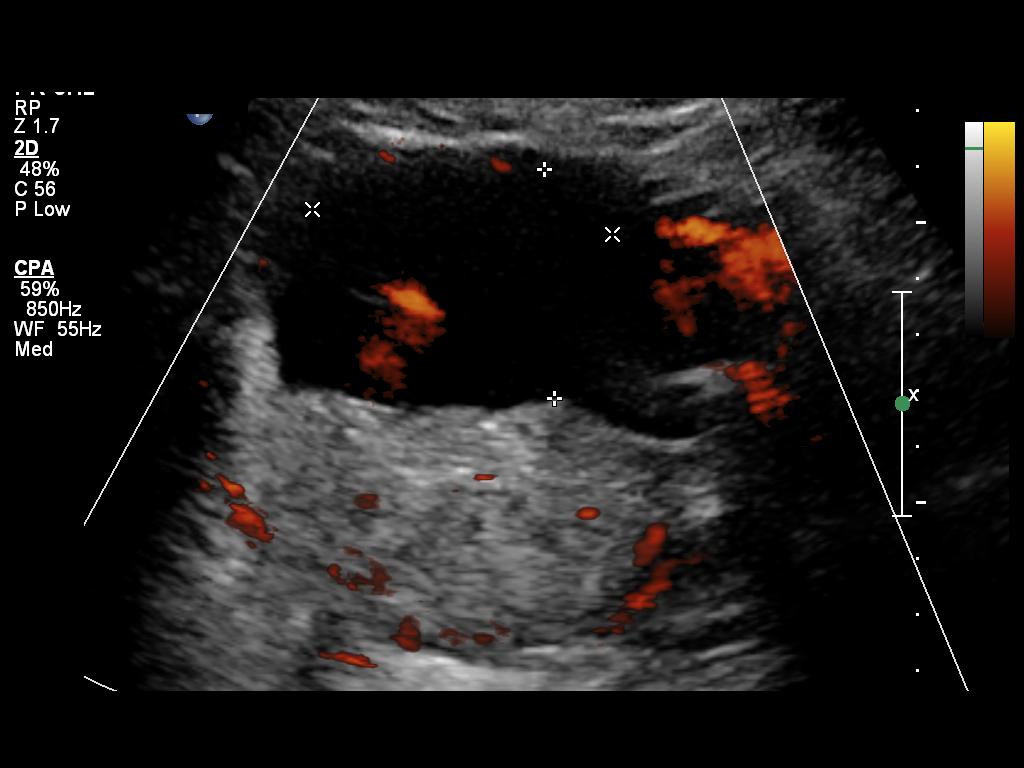
[im 4/7]
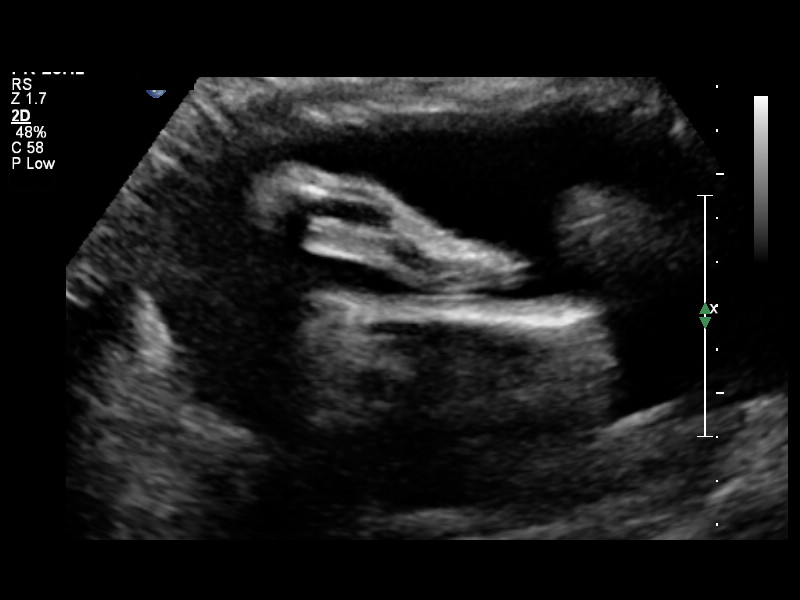
[im 5/7]
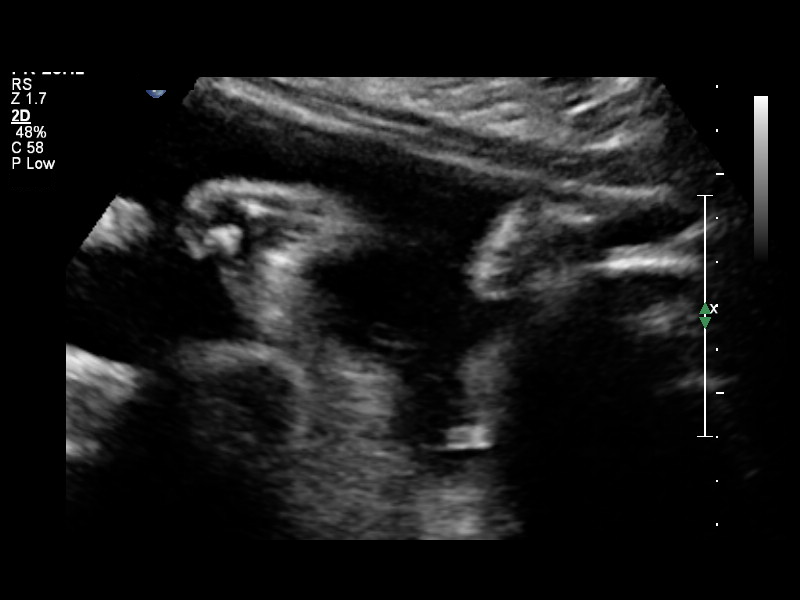
[im 6/7]
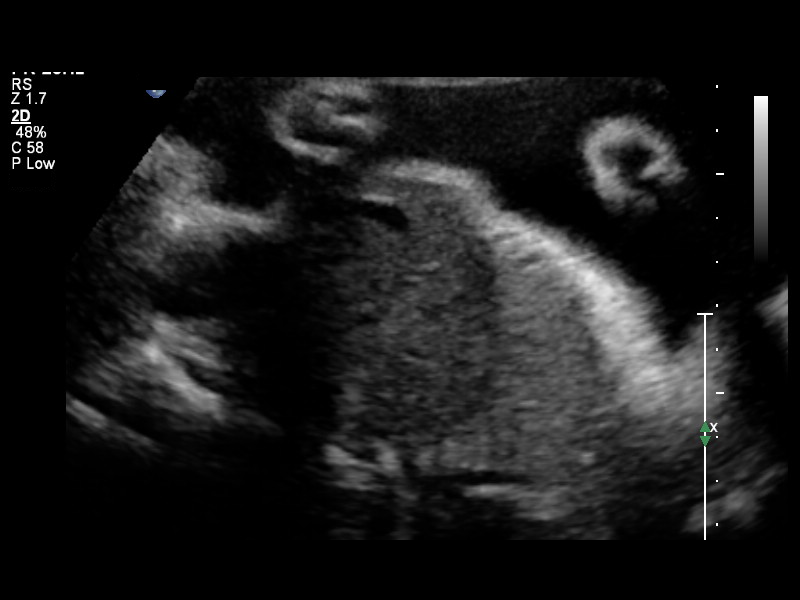
[im 7/7]
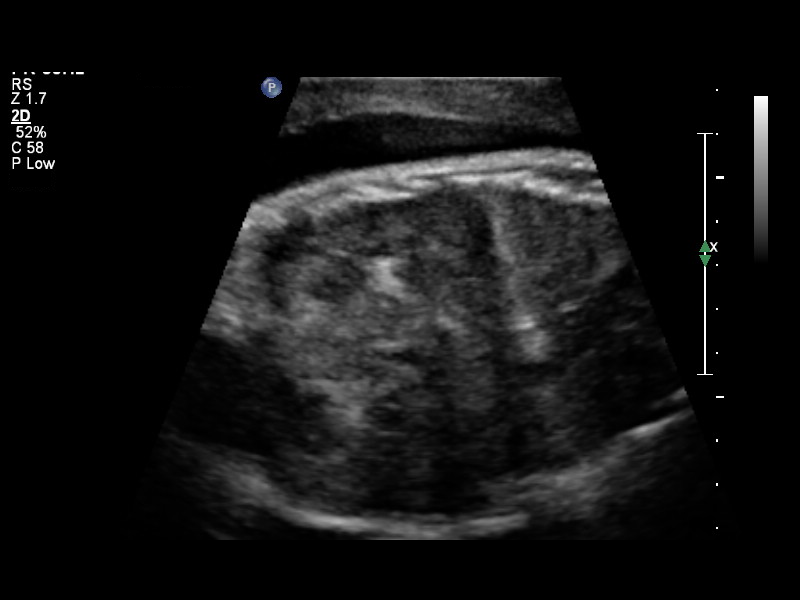

[7 of 7 positions shown; findings below may reference images not displayed]

OBSTETRICS REPORT
                      (Signed Final 05/15/2012 [DATE])

Service(s) Provided

Indications

 Non-reactive NST
 [DATE] BPP prior US (x 2)
Fetal Evaluation

 Num Of Fetuses:    1
 Fetal Heart Rate:  139                         bpm
 Cardiac Activity:  Observed
 Presentation:      Cephalic

 Comment:    Breathing observed, but not sustained for 30 secs.

 Amniotic Fluid
 AFI FV:      Subjectively within normal limits
                                             Larg Pckt:     4.1  cm
Biophysical Evaluation

 Amniotic F.V:   Within normal limits       F. Tone:        Observed
 F. Movement:    Observed                   Score:          [DATE]
 F. Breathing:   Not Observed
Gestational Age

 LMP:           32w 4d       Date:   09/30/11                 EDD:   07/06/12
 Best:          32w 4d    Det. By:   LMP  (09/30/11)          EDD:   07/06/12
Impression

 Biophysical profile score of [DATE].

 questions or concerns.
